# Patient Record
Sex: Female | Born: 1975 | Race: Asian | Hispanic: No | Marital: Married | Smoking: Never smoker
Health system: Southern US, Community
[De-identification: ages and names within clinical notes are randomized; demographics above are authoritative.]

## PROBLEM LIST (undated history)

## (undated) DIAGNOSIS — G43909 Migraine, unspecified, not intractable, without status migrainosus: Secondary | ICD-10-CM

## (undated) HISTORY — DX: Migraine, unspecified, not intractable, without status migrainosus: G43.909

---

## 2003-09-29 ENCOUNTER — Other Ambulatory Visit: Admission: RE | Admit: 2003-09-29 | Discharge: 2003-09-29 | Payer: Self-pay | Admitting: Obstetrics & Gynecology

## 2005-01-31 ENCOUNTER — Inpatient Hospital Stay (HOSPITAL_COMMUNITY): Admission: AD | Admit: 2005-01-31 | Discharge: 2005-02-04 | Payer: Self-pay | Admitting: Obstetrics & Gynecology

## 2005-02-01 ENCOUNTER — Encounter (INDEPENDENT_AMBULATORY_CARE_PROVIDER_SITE_OTHER): Payer: Self-pay | Admitting: *Deleted

## 2005-02-05 ENCOUNTER — Encounter: Admission: RE | Admit: 2005-02-05 | Discharge: 2005-02-20 | Payer: Self-pay | Admitting: Obstetrics & Gynecology

## 2006-07-29 ENCOUNTER — Ambulatory Visit: Payer: Self-pay | Admitting: Family Medicine

## 2007-05-21 ENCOUNTER — Emergency Department: Payer: Self-pay | Admitting: Emergency Medicine

## 2010-01-02 ENCOUNTER — Inpatient Hospital Stay (HOSPITAL_COMMUNITY): Admission: RE | Admit: 2010-01-02 | Discharge: 2010-01-04 | Payer: Self-pay | Admitting: Obstetrics and Gynecology

## 2010-01-02 ENCOUNTER — Encounter (INDEPENDENT_AMBULATORY_CARE_PROVIDER_SITE_OTHER): Payer: Self-pay | Admitting: Obstetrics and Gynecology

## 2010-01-13 ENCOUNTER — Ambulatory Visit: Payer: Self-pay | Admitting: Nurse Practitioner

## 2010-01-13 ENCOUNTER — Inpatient Hospital Stay (HOSPITAL_COMMUNITY): Admission: AD | Admit: 2010-01-13 | Discharge: 2010-01-13 | Payer: Self-pay | Admitting: Obstetrics and Gynecology

## 2010-09-30 LAB — CBC
MCHC: 34 g/dL (ref 30.0–36.0)
MCV: 83.1 fL (ref 78.0–100.0)
Platelets: 167 10*3/uL (ref 150–400)
Platelets: 213 10*3/uL (ref 150–400)
RBC: 3.9 MIL/uL (ref 3.87–5.11)
RDW: 14.1 % (ref 11.5–15.5)
RDW: 14.3 % (ref 11.5–15.5)
WBC: 7.7 10*3/uL (ref 4.0–10.5)

## 2010-09-30 LAB — RPR: RPR Ser Ql: NONREACTIVE

## 2010-11-28 LAB — HM PAP SMEAR: HM Pap smear: NORMAL

## 2010-11-30 NOTE — H&P (Signed)
[name], [name]                ACCOUNT NO.:  [id number]   MEDICAL RECORD NO.:  [id number]          PATIENT TYPE:  INP   LOCATION:  9112                          FACILITY:  [facility]   PHYSICIAN:  Genia Del, M.D.DATE OF BIRTH:  12-Jul-1976   DATE OF ADMISSION:  01/31/2005  DATE OF DISCHARGE:                                HISTORY & PHYSICAL   [name] is a 35 year old G3, P0, A2, expected date of delivery by  ultrasound January 20, 2005, at 41 weeks 4 days' gestation.   REASON FOR ADMISSION:  Fluid leak since morning of July 20.   HISTORY OF PRESENT ILLNESS:  The patient felt some fluid leak but not  continuously since around 7 a.m. on July 20, increased uterine contractions  with some pain but still irregular.  Fetal movement is positive.  No PIH  symptoms.   PAST MEDICAL HISTORY:  Negative.   PAST SURGICAL HISTORY:  Negative.   PAST OBSTETRICAL HISTORY:  Positive for spontaneous abortions x2, one in  February 2005, the other one in July 2005, with no complications.   FAMILY HISTORY:  Positive for thalassemia.   SOCIAL HISTORY:  The patient is married.  The patient is vegetarian but eats  fish and eggs.   MEDICATIONS:  Prenate vitamins.   No known drug allergies.   HISTORY OF PRESENT PREGNANCY:  First trimester was normal.  An ultrasound  was done at 6+ weeks because of the previous history of spontaneous  abortion.  First trimester labs:  Hemoglobin was 12.5, platelets 237.  B  positive.  Rh negative.  Electrophoresis of hemoglobin normal.  STD workup  negative.  Rubella titer immune.  In the second trimester the patient  declined the triple test.  Ultrasound review of anatomy was within normal  limits at 21 weeks.  In the third trimester, one-hour glucose tolerance test  was normal at 87.  Hemoglobin was 10.4.  Group B strep was negative at 35+  weeks.  Uterine height corresponded well.  Blood pressures remained normal.   REVIEW OF SYSTEMS:  CONSTITUTIONAL:   Negative.  HEENT:  Negative.  CARDIOVASCULAR:  Negative.  RESPIRATORY:  Negative.  GASTROINTESTINAL:  Negative.  UROLOGIC:  Negative.  DERMATOLOGIC:  Negative.  NEUROLOGIC:  Negative.  ENDOCRINOLOGIC:  Negative.   PHYSICAL EXAMINATION:  GENERAL:  No apparent distress.  VITAL SIGNS:  Blood pressure 116/70, regular pulse, respiratory rate normal,  afebrile.  CHEST:  Lungs are clear bilaterally.  CARDIAC:  Regular cardiac rhythm.  ABDOMEN:  Gravid uterus, cephalic presentation.  Fetal heart rate 145.  PELVIC:  Vaginal exam 1 cm dilated, long and posterior cervix, vertex, -2.  Nitrazine positive, fern positive.   IMPRESSION:  Forty-one weeks four days' gestation with spontaneous rupture  of membranes, group B strep negative, not currently in active labor.   PLAN:  Admit to labor and delivery.  Monitoring.  Induction with Pitocin.  Antibiotics at 12 hours from spontaneous rupture of membranes.  Expectant  management with probable vaginal delivery.       ML/MEDQ  D:  02/02/2005  T:  02/02/2005  Job:  [id number]

## 2010-11-30 NOTE — Discharge Summary (Signed)
**Note Gina via Obfuscation** NAMEDEBERAH, ADOLF                ACCOUNT NO.:  [id number]   MEDICAL RECORD NO.:  [id number]          PATIENT TYPE:  INP   LOCATION:  9112                          [facility]ACILITY:  [facility]   PHYSICIAN:  Maxie Better, M.D.DATE OF BIRTH:  [birth year]   DATE OF ADMISSION:  01/31/2005  DATE OF DISCHARGE:  02/04/2005                                 DISCHARGE SUMMARY   ADMISSION DIAGNOSES:  1.  Post dates.  2.  Spontaneous rupture of membranes.   DISCHARGE DIAGNOSES:  1.  Post dates, delivered.  2.  Arrest of dilatation.  3.  Presumed chorioamnionitis.   PROCEDURE:  Primary low transverse cesarean section.   HISTORY OF PRESENT ILLNESS:  A 35 year old gravida 3, para 0 female at 53-  4/7 weeks admitted by Dr. Seymour Bars on January 31, 2005 with spontaneous rupture  of membranes.  Her group B Strep culture was negative.   HOSPITAL COURSE:  The patient was admitted.  At the time of her admission  she was 1 cm, long, posterior, and -2, fern positive.  The patient was  monitored.  The patient was started on Pitocin by Dr. Billy Coast on July 21.  An  intrauterine pressure catheter was subsequently placed.  At the time the  examination was 2-3, 80%, 0 station.  She subsequently had an epidural  placed.  The uterine contractions were noted to be inadequate.  Pitocin was  continued.  Unasyn was started on order by Dr. Seymour Bars.  The patient  subsequently spiked a temperature to 100.3 during her labor course.  At that  time she was 4, 80%, -1.  Fetal heart rate tracing showed a baseline of  150s, contracting every three minutes.  Gentamicin was added to her regimen.  Cervical examination remained unchanged and recommendation for cesarean  section was then done.  The patient was taken to the operating room after  obtaining consent and risk of procedure reviewed.  The findings at the time  of surgery which was a primary cesarean section was a live female 8 pounds 1  ounce in the left occiput transverse position  compound presentation, Apgars  of 8 and 9.  The placenta was sent to pathology that showed some alveolitis  of uncertain etiology.  Clindamycin was added to her regimen  postoperatively.  The patient had increase of vaginal bleeding  postoperatively which resulted in Methergine being given.  Her CBC on  postoperative day #1 showed a hemoglobin 7, white count 11,000.  Her  preoperative hemoglobin, however, was 10.2.  Platelet count postoperative  day #1 was 271,000.  She was continued on antibiotics until she became  afebrile greater than 24 hours.  By postoperative day #3 the patient had a  bowel movement tolerating regular diet.  Incision showed no erythema,  induration, or exudate.  She was asymptomatic from her anemia and was deemed  well to be discharged home.   DISPOSITION:  Home.   CONDITION ON DISCHARGE:  Stable.   DISCHARGE MEDICATIONS:  1.  Tylox #30 one p.o. q.4h. p.r.n. pain.  2.  Chromagen Forte one p.o. b.i.d.  3.  Continue prenatal vitamins.   DISCHARGE INSTRUCTIONS:  Per the postpartum booklet given.   FOLLOW-UP APPOINTMENT:  In four to six weeks at [facility].      Maxie Better, M.D.  Electronically Signed     [place]/MEDQ  D:  03/11/2005  T:  03/11/2005  Job:  [id number]

## 2010-11-30 NOTE — Op Note (Signed)
[name], [name]                ACCOUNT NO.:  [id number]   MEDICAL RECORD NO.:  [id number]          PATIENT TYPE:  INP   LOCATION:  9112                          [facility]   PHYSICIAN:  Maxie Better, M.D.DATE OF BIRTH:  April 10, 1976   DATE OF PROCEDURE:  02/01/2005  DATE OF DISCHARGE:                                 OPERATIVE REPORT   PREOPERATIVE DIAGNOSES:  1.  Arrest of dilatation.  2.  Presumed chorioamnionitis.  3.  Post dates.   OPERATION/PROCEDURE:  1.  Primary cesarean section.  2.  Sharl Ma hysterotomy.   POSTOPERATIVE DIAGNOSES:  1.  Arrest of dilatation.  2.  Presumed chorioamnionitis.  3.  Post dates.   ANESTHESIA:  Epidural.   SURGEON:  Maxie Better, M.D.   ASSISTANT:  None.   DESCRIPTION OF PROCEDURE:  Under adequate epidural anesthesia, the patient  was placed in the supine position with the left lateral tilt.  An indwelling  Foley catheter was already in place.  The patient was sterilely prepped and  draped in the usual fashion and 0.25% Marcaine was injected along the  planned Pfannenstiel skin incision.  Pfannenstiel skin incision was then  made, carried down to the rectus fascia.  Rectus fascia was incised in the  midline, extended bilaterally.  The rectus fascia was then bluntly and  sharply dissected off the rectus muscle in superior and inferior fashion.  The rectus muscle was split in the midline.  The parietal peritoneum was  entered sharply and extended.  The lower uterine segment was well developed.  The vesicouterine peritoneum then opened transversely.  The bladder was then  bluntly dissected off the lower uterine segment and displaced inferiorly  using a bladder retractor.  A curvilinear low transverse uterine incision  was then made and extended bilaterally using the bandage scissors.  On entry  into the endometrial cavity, meconium fluid was noted.  The baby was in the  left occiput transverse position.  Vertex was then  elevated into the field.  Vacuum was applied to facilitate the delivery.  Subsequently a live female  was delivered with the right hand  to the shoulder level.  Baby DeLee'd and  bulb suctioned on the abdomen.  Cord was clamped and the baby was  transferred to the awaiting pediatrician who assigned Apgars of 8 and 9 at  one and five minutes.  The placenta was spontaneous intact and sent to  pathology. The uterus was  exteriorized.  The uterus was atonic.  Manual  massage was performed.  Pitocin was started.  The uterus was cleaned of  debris.  The uterine incision had no extension.  Uterine was closed with in  two layers, the first layer with 0 Monocryl, running locked stitch.  Second  layer was imbricating using 0 Monocryl suture.  Good hemostasis was noted.  The uterus was then returned to the abdomen.  Normal tubes and ovaries were  noted bilaterally.  The abdomen was copiously irrigated and suctioned of  debris.  Inspection of the incision showed good hemostasis.  The parietal  peritoneum was then closed with 2-0  Vicryl sutures.  The rectus fascia was  closed with 0 Vicryl x2.  The skin was approximated using Ethicon staples.  Specimen was placenta sent to pathology.  Estimated blood loss was 700 mL.   SPECIMENS:  Placenta sent to pathology.   ESTIMATED BLOOD LOSS:  700 mL.   INTRAOPERATIVE FLUIDS:  2 L.   URINARY OUTPUT:  200 mL clear yellow urine.   COUNTS:  Sponge and instruments counts x3 were correct.   COMPLICATIONS:  None.   The weight of the baby was 8 pounds 1 ounce.  The patient tolerated the  procedure well and was transverse to the recovery room in stable condition.       [place]/MEDQ  D:  02/02/2005  T:  02/02/2005  Job:  [id number]

## 2010-11-30 NOTE — Assessment & Plan Note (Signed)
[facility]                           [place] OFFICE NOTE   [name], [name]                         MRN:          [id number]  DATE:07/29/2006                            DOB:          [birth year]    CHIEF COMPLAINT:  35 year old female here to establish new doctor.   HISTORY OF PRESENT ILLNESS:  [name] moved to this area from  [place] in August 2006.  She has been cared for over the last year by  an OB/GYN because she has had a recent delivery in July 2007.  She has  had some difficulty losing weight from her pregnancy and would like to  discuss that briefly.  In addition she has been having thinning of her  hair over the past 2 months.  She states that this has stopped now, but  her hair is still much thinner that it was before.  She had noticed  multiple hairs coming out when brushing her hair.  She reports good  energy, normal mood, and no problems with thyroid in the family.   REVIEW OF SYSTEMS:  Frequent headache, uses Advil, happens q. 1-2  months.  Lasts a few hours per day, associated with nausea, vomiting,  photo and phonophobia.  No glasses, no problems with hearing, no  dyspnea, no chest pain, no shortness of breath, no diarrhea, no  constipation, no rectal bleeding, no myalgia nor arthralgia.   PAST MEDICAL HISTORY:  None.   HOSPITALIZATION/SURGERIES/PROCEDURES:  C-section in July 2007.  Pap  smear in August 2007.   SOCIAL HISTORY:  No smoking, no alcohol, no drug use.  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  She has been married for 7 years and denies  physical or domestic abuse.  She is from [place] originally.  She has one  female child who is healthy.  She walks 3 times per week on a treadmill.  Her mother and father-in-law live with the family.  She eats fruits and  vegetables, and is a vegetarian.  She gets her protein through beans and  pulses as well as milk.   FAMILY HISTORY:  Her father is alive at age 73  with migraine.  Mother  alive at age 3 with asthma, hypertension.  No family history of heart  attack before age 36.  She has one sister who has problems with  headaches.  No diabetes, no types of cancer run in the family.   PHYSICAL EXAMINATION:  VITAL SIGNS:  Height 63 inches, weight 135 making  BMI 25.  Blood pressure 102/60, pulse 76, temperature 98.  GENERAL:  Healthy-appearing slightly overweight female in no apparent  distress.  HEENT:  PERRLA, extraocular muscles intact, oropharynx clear, tympanic  membranes clear, nares clear, no thyromegaly, no lymphadenopathy  supraclavicular or cervical.  CARDIOVASCULAR:  Regular rate and rhythm, no murmurs, rubs, or gallops.  PULMONARY:  Clear to auscultation bilaterally, no wheezes, rales, or  rhonchi.  ABDOMEN:  Soft, nontender, normoactive bowel sounds, no  hepatosplenomegaly.  MUSCULOSKELETAL:  Strength 5/5 in upper and lower extremities.  NEURO:  Alert  and oriented x3, cranial nerves II-XII grossly intact.   ASSESSMENT/PLAN:  1. Telogen effluvium:  Most likely her hair loss was secondary to post      partum alopecia or stress within the last few months.  She was      given the information about this and reassured that hair loss was      unlikely to recur.  We can also consider checking a thyroid panel,      although she has no other symptoms at this point in time.  2. Frequent headaches:  Her description of headaches meet the      diagnosis of migraine.  She will continue to use Advil as needed.      She will let me know if her headaches become more frequent or need      stronger medicine for acute treatment.  She will also look into      possible migraine triggers.  3. Prevention:  She is up to date with Pap Smear.  She is up to date      with her vaccines.  She will consider getting a cholesterol and      complete metabolic panel, although it sounds like she had this      within the last year.  We can obtain this within the last  year.  I      did tell her that there is no need if it was normal last year to      repeat it this year, and that she needs it more like every 5 years.      She was encouraged to continue exercise and healthy eating habits.     Kerby Nora, MD  Electronically Signed    AB/MedQ  DD: 07/29/2006  DT: 07/30/2006  Job #: [id number]

## 2012-04-08 ENCOUNTER — Telehealth: Payer: Self-pay | Admitting: Family Medicine

## 2012-04-08 NOTE — Telephone Encounter (Signed)
Okay to re-establish? 

## 2012-04-08 NOTE — Telephone Encounter (Signed)
The pt called hoping to re-est with you as a patient.   Her last ov was 07/2006.   Thanks!

## 2012-04-09 NOTE — Telephone Encounter (Signed)
I left a message on patient's voice mail that Dr.Bedsole said it's ok for her to re-establish.  I asked patient to call me back to schedule appointment.

## 2012-04-09 NOTE — Telephone Encounter (Signed)
Pt called back and scheduled for her and her husband. Thanks!

## 2012-05-12 ENCOUNTER — Encounter: Payer: Self-pay | Admitting: Family Medicine

## 2012-05-12 ENCOUNTER — Ambulatory Visit (INDEPENDENT_AMBULATORY_CARE_PROVIDER_SITE_OTHER): Payer: BC Managed Care – PPO | Admitting: Family Medicine

## 2012-05-12 ENCOUNTER — Other Ambulatory Visit (HOSPITAL_COMMUNITY)
Admission: RE | Admit: 2012-05-12 | Discharge: 2012-05-12 | Disposition: A | Discharge status: Home / Self Care | Payer: BC Managed Care – PPO | Source: Ambulatory Visit | Attending: Family Medicine | Admitting: Family Medicine

## 2012-05-12 VITALS — BP 100/70 | Temp 98.3°F | Ht 63.0 in | Wt 139.0 lb

## 2012-05-12 DIAGNOSIS — Z01419 Encounter for gynecological examination (general) (routine) without abnormal findings: Secondary | ICD-10-CM | POA: Insufficient documentation

## 2012-05-12 DIAGNOSIS — Z13220 Encounter for screening for lipoid disorders: Secondary | ICD-10-CM

## 2012-05-12 DIAGNOSIS — Z1151 Encounter for screening for human papillomavirus (HPV): Secondary | ICD-10-CM | POA: Insufficient documentation

## 2012-05-12 DIAGNOSIS — Z0000 Encounter for general adult medical examination without abnormal findings: Secondary | ICD-10-CM

## 2012-05-12 LAB — HM PAP SMEAR: HM PAP: NEGATIVE

## 2012-05-12 NOTE — Patient Instructions (Addendum)
Get back on track with diet and exercise.  Return for fasting labs.

## 2012-05-12 NOTE — Progress Notes (Signed)
Subjective:    Patient ID: [name], female    DOB: [birth year], 36 y.o.   MRN: [id number]  HPI The patient is here for annual wellness exam and preventative care.     Has not had a CPX in 2 years.  She noted red patched on abdomen and  Breast abdomen 1 month ago, gone now.  Mildly itchy. No new exposures.  Dances 1 hour per week. No other exercise.  Diet:moderate.  Review of Systems  Constitutional: Negative for fever, fatigue and unexpected weight change.  HENT: Negative for ear pain, congestion, sore throat, sneezing, trouble swallowing and sinus pressure.   Eyes: Negative for pain and itching.  Respiratory: Negative for cough, shortness of breath and wheezing.   Cardiovascular: Negative for chest pain, palpitations and leg swelling.  Gastrointestinal: Negative for nausea, abdominal pain, diarrhea, constipation and blood in stool.  Genitourinary: Negative for dysuria, hematuria, vaginal discharge, difficulty urinating and menstrual problem.  Skin: Negative for rash.  Neurological: Positive for headaches. Negative for syncope, weakness, light-headedness and numbness.  Psychiatric/Behavioral: Negative for confusion and dysphoric mood. The patient is not nervous/anxious.        Objective:   Physical Exam  Constitutional: Vital signs are normal. She appears well-developed and well-nourished. She is cooperative.  Non-toxic appearance. She does not appear ill. No distress.  HENT:  Head: Normocephalic.  Right Ear: Hearing, tympanic membrane, external ear and ear canal normal.  Left Ear: Hearing, tympanic membrane, external ear and ear canal normal.  Nose: Nose normal.  Eyes: Conjunctivae normal, EOM and lids are normal. Pupils are equal, round, and reactive to light. No foreign bodies found.  Neck: Trachea normal and normal range of motion. Neck supple. Carotid bruit is not present. No mass and no thyromegaly present.  Cardiovascular: Normal rate, regular rhythm, S1 normal,  S2 normal, normal heart sounds and intact distal pulses.  Exam reveals no gallop.   No murmur heard. Pulmonary/Chest: Effort normal and breath sounds normal. No respiratory distress. She has no wheezes. She has no rhonchi. She has no rales.  Abdominal: Soft. Normal appearance and bowel sounds are normal. She exhibits no distension, no fluid wave, no abdominal bruit and no mass. There is no hepatosplenomegaly. There is no tenderness. There is no rebound, no guarding and no CVA tenderness. No hernia.  Genitourinary: Vagina normal and uterus normal. No breast swelling, tenderness, discharge or bleeding. Pelvic exam was performed with patient prone. There is no rash, tenderness or lesion on the right labia. There is no rash, tenderness or lesion on the left labia. Uterus is not enlarged and not tender. Cervix exhibits no motion tenderness, no discharge and no friability. Right adnexum displays no mass, no tenderness and no fullness. Left adnexum displays no mass, no tenderness and no fullness.  Lymphadenopathy:    She has no cervical adenopathy.    She has no axillary adenopathy.  Neurological: She is alert. She has normal strength. No cranial nerve deficit or sensory deficit.  Skin: Skin is warm, dry and intact. No rash noted.  Psychiatric: Her speech is normal and behavior is normal. Judgment normal. Her mood appears not anxious. Cognition and memory are normal. She does not exhibit a depressed mood.          Assessment & Plan:  The patient's preventative maintenance and recommended screening tests for an annual wellness exam were reviewed in full today. Brought up to date unless services declined.  Counselled on the importance of diet, exercise, and its  role in overall health and mortality. The patient's FH and SH was reviewed, including their home life, tobacco status, and drug and alcohol status.   Vaccines:Tdap, refuses flu vaccine Nonsmoker PAP/DVE: Last 2011.  Breast exam: No family  history. No rash remains.

## 2012-06-29 ENCOUNTER — Ambulatory Visit (INDEPENDENT_AMBULATORY_CARE_PROVIDER_SITE_OTHER): Payer: BC Managed Care – PPO | Admitting: Family Medicine

## 2012-06-29 ENCOUNTER — Encounter: Payer: Self-pay | Admitting: Family Medicine

## 2012-06-29 VITALS — BP 120/70 | HR 110 | Temp 100.1°F | Ht 63.0 in | Wt 138.5 lb

## 2012-06-29 DIAGNOSIS — J111 Influenza due to unidentified influenza virus with other respiratory manifestations: Secondary | ICD-10-CM

## 2012-06-29 DIAGNOSIS — R509 Fever, unspecified: Secondary | ICD-10-CM

## 2012-06-29 LAB — POCT INFLUENZA A/B: Influenza A, POC: POSITIVE

## 2012-06-29 MED ORDER — OSELTAMIVIR PHOSPHATE 75 MG PO CAPS: 75.0000 mg | ORAL_CAPSULE | Freq: Two times a day (BID) | ORAL | Status: DC

## 2012-06-29 MED ORDER — HYDROCOD POLST-CHLORPHEN POLST 10-8 MG/5ML PO LQCR: ORAL | Status: DC

## 2012-06-29 NOTE — Progress Notes (Signed)
Patient Name: [name] Date of Birth: 02/09/1976 Medical Record Number: [id number] Gender: female  History of Present Illness:  [name] presents with runny nose, sneezing, cough, sore throat, malaise, myalgias, arthralgia, chills, and fever. Yellow mucous Started on Friday 103 fever last night. 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 No known flu.  The patent denies sore throat as the primary complaint. Denies sthortness of breath/wheezing, otalgia, facial pain, abdominal pain, changes in bowel or bladder.  Generally feels terrible  Tmax: 103  PMH, PHS, Allergies, Problem List, Medications, Family History, and Social History have all been reviewed.  Review of Systems: as above, eating and drinking - tolerating PO. Urinating normally. No excessive vomitting or diarrhea. O/w as above.  Physical Exam:  Filed Vitals:   06/29/12 1030  BP: 120/70  Pulse: 110  Temp: 100.1 F (37.8 C)  TempSrc: Oral  Height: 5\' 3"  (1.6 m)  Weight: 138 lb 8 oz (62.823 kg)  SpO2: 96%    Gen: WDWN, NAD; A & O x3, cooperative. Pleasant.Globally Non-toxic HEENT: Normocephalic and atraumatic. Throat clear, w/o exudate, R TM clear, L TM - good landmarks, No fluid present. rhinnorhea. No frontal or maxillary sinus T. MMM NECK: Anterior cervical  LAD is absent CV: RRR, No M/G/R, cap refill <2 sec PULM: Breathing comfortably in no respiratory distress. no wheezing, crackles, rhonchi ABD: S,NT,ND,+BS. No HSM. No rebound. EXT: No c/c/e PSYCH: Friendly, good eye contact MSK: Nml gait  Assessment and Plan: 1. Influenza: The patient's clinical exam and history is consistent with a diagnosis of influenza.  Supportive care. Fluids. Cough medicines as needed  Anti-pyretics.  Infection control emphasized, including OOW or school until AF 24 hours.    1. Influenza with respiratory manifestations    2. Fever  Influenza A/B   Results for orders placed in  visit on 06/29/12  POCT INFLUENZA A/B      Component Value Range   Influenza A, POC Positive     Influenza B, POC        Updated Medication List: (Includes new medications, updates to list, dose adjustments) Meds ordered this encounter  Medications  . oseltamivir (TAMIFLU) 75 MG capsule    Sig: Take 1 capsule (75 mg total) by mouth 2 (two) times daily.    Dispense:  10 capsule    Refill:  0  . chlorpheniramine-HYDROcodone (TUSSIONEX) 10-8 MG/5ML LQCR    Sig: 5 mL po qhs prn cough    Dispense:  240 mL    Refill:  0    Medications Discontinued: There are no discontinued medications.

## 2012-07-01 ENCOUNTER — Telehealth: Payer: Self-pay | Admitting: Family Medicine

## 2012-07-01 NOTE — Telephone Encounter (Signed)
Patient Information:  Caller Name: Khala  Phone: [phone]  Patient: [name], [name]  Gender: Female  DOB: 12-May-1976  Age: 36 Years  PCP: Kerby Nora (Family Practice)  Pregnant: No  Office Follow Up:  Does the office need to follow up with this patient?: No  Instructions For The Office: N/A  RN Note:  Per [employer] advised that she may take medications concurrently.  Lost connection near conclusion of the call. Left voice mail reviewing home care instructions and to call back if further questions or concerns.  Symptoms  Reason For Call & Symptoms: Flu diagnosed by Dr. Patsy Lager on 06/29/12.  Headache does not go away.  Fever improved.  Headache rated at 2-3 of 10.  Caller asks if she can take Acetaminophen 250mg  and Aspirin 250 mg along with Tamiflu.  Reviewed Health History In EMR: Yes  Reviewed Medications In EMR: Yes  Reviewed Allergies In EMR: Yes  Reviewed Surgeries / Procedures: Yes  Date of Onset of Symptoms: 06/28/2012  Treatments Tried: Tamiflu, Rx cough syrup.  Some improvement.  Treatments Tried Worked: Yes OB:  LMP: 06/03/2012  Guideline(s) Used:  Headache  Headache  Influenza - Seasonal  Influenza Follow-Up Call  Disposition Per Guideline:   Home Care  Reason For Disposition Reached:   Influenza (diagnosed by HCP) and no complications  Advice Given:  Rest:   Lie down in a dark, quiet place and try to relax. Close your eyes and imagine your entire body relaxing.  Apply Cold to the Area:   Apply a cold wet washcloth or cold pack to the forehead for 20 minutes.  Call Back If:  Headache lasts longer than 24 hours  You become worse.  Influenza - Symptoms:  However, the fever is usually higher (102 - 104 F; 38.9 - 40 C) with influenza than with a cold. Headaches and muscle aches are also worse with Influenza.  The symptoms often come on suddenly. One day a person can feel fine and the next day feel miserable.  Influenza - How to Protect Others -  Avoid People at HIGH RISK for Complications:  Persons less than 73 years old  Persons 57 years and older  Pregnant women and women up to 2 weeks postpartum  Chronic medical conditions, including: cardiovascular (not hypertension), chronic pulmonary conditions (e.g., asthma, emphysema), renal failure, hepatic, hematologic (e.g., sickle cell disease), neurologic, neuromuscular, and diabetes mellitus  Influenza - Isolation Is Needed Until After Fever Is Gone:  If you have flu-like symptoms, please stay at home until at least 24 hours after you are free of fever.  Do Not go to work or school.  Do Not go to church, child care centers, shopping, or other public places.  Do Not shake hands.  Do Not share eating utensils (e.g., spoon, fork)  Avoid close contact with others (hugging, kissing).

## 2012-07-01 NOTE — Telephone Encounter (Signed)
Noted  

## 2012-07-01 NOTE — Telephone Encounter (Signed)
Agree   Hannah Beat, MD 07/01/2012, 2:52 PM

## 2012-09-07 ENCOUNTER — Ambulatory Visit (INDEPENDENT_AMBULATORY_CARE_PROVIDER_SITE_OTHER): Payer: BC Managed Care – PPO | Admitting: Family Medicine

## 2012-09-07 ENCOUNTER — Encounter: Payer: Self-pay | Admitting: Family Medicine

## 2012-09-07 VITALS — BP 100/72 | HR 77 | Temp 98.3°F | Ht 63.0 in | Wt 143.5 lb

## 2012-09-07 DIAGNOSIS — J020 Streptococcal pharyngitis: Secondary | ICD-10-CM

## 2012-09-07 DIAGNOSIS — J029 Acute pharyngitis, unspecified: Secondary | ICD-10-CM

## 2012-09-07 LAB — POCT RAPID STREP A (OFFICE): Rapid Strep A Screen: POSITIVE — AB

## 2012-09-07 MED ORDER — AMOXICILLIN 500 MG PO TABS: 1000.0000 mg | ORAL_TABLET | Freq: Two times a day (BID) | ORAL | Status: DC

## 2012-09-07 NOTE — Assessment & Plan Note (Signed)
amox x 10 days, symptomatic care.

## 2012-09-07 NOTE — Progress Notes (Signed)
  Subjective:    Patient ID: [name], female    DOB: 1975-10-13, 37 y.o.   MRN: [id number]  Sore Throat  This is a new problem. The current episode started yesterday. The problem has been gradually worsening. Neither side of throat is experiencing more pain than the other. There has been no fever. Associated symptoms include congestion, coughing, headaches, swollen glands and trouble swallowing. Pertinent negatives include no drooling, ear pain, neck pain or shortness of breath. Associated symptoms comments: Fatigue  mild cough, congestion. She has had no exposure to strep or mono. Exposure to: son with viral infection, works at [employer]. She has tried NSAIDs for the symptoms. The treatment provided mild relief.      Review of Systems  HENT: Positive for congestion and trouble swallowing. Negative for ear pain, drooling and neck pain.   Respiratory: Positive for cough. Negative for shortness of breath.   Neurological: Positive for headaches.       Objective:   Physical Exam  Constitutional: Vital signs are normal. She appears well-developed and well-nourished. She is cooperative.  Non-toxic appearance. She does not appear ill. No distress.  HENT:  Head: Normocephalic.  Right Ear: Hearing, tympanic membrane, external ear and ear canal normal. Tympanic membrane is not erythematous, not retracted and not bulging.  Left Ear: Hearing, tympanic membrane, external ear and ear canal normal. Tympanic membrane is not erythematous, not retracted and not bulging.  Nose: Mucosal edema and rhinorrhea present. Right sinus exhibits no maxillary sinus tenderness and no frontal sinus tenderness. Left sinus exhibits no maxillary sinus tenderness and no frontal sinus tenderness.  Mouth/Throat: Uvula is midline and mucous membranes are normal. Posterior oropharyngeal erythema present. No oropharyngeal exudate or tonsillar abscesses.  Eyes: Conjunctivae, EOM and lids are normal. Pupils are equal, round,  and reactive to light. No foreign bodies found.  Neck: Trachea normal and normal range of motion. Neck supple. Carotid bruit is not present. No mass and no thyromegaly present.  Cardiovascular: Normal rate, regular rhythm, S1 normal, S2 normal, normal heart sounds, intact distal pulses and normal pulses.  Exam reveals no gallop and no friction rub.   No murmur heard. Pulmonary/Chest: Effort normal and breath sounds normal. Not tachypneic. No respiratory distress. She has no decreased breath sounds. She has no wheezes. She has no rhonchi. She has no rales.  Neurological: She is alert.  Skin: Skin is warm, dry and intact. No rash noted.  Psychiatric: Her speech is normal and behavior is normal. Judgment normal. Her mood appears not anxious. Cognition and memory are normal. She does not exhibit a depressed mood.          Assessment & Plan:

## 2012-09-07 NOTE — Patient Instructions (Addendum)
Complete amoxicillin x 10 days, Ibuprofen 600 mg every 6 hours for pain and fever.

## 2012-09-14 ENCOUNTER — Telehealth: Payer: Self-pay | Admitting: *Deleted

## 2012-09-14 NOTE — Telephone Encounter (Signed)
Pt c/o diarrhea since started abx last Mon. She had severe cramping and episodes of diarrhea 3 times a day. She stopped taking abx on Sat and wants to know if you can call anything else in or change the dose of the abx she was on. [facility]ses [facility] [address]

## 2012-09-14 NOTE — Telephone Encounter (Signed)
Zpak, 2 tabs po on day 1, then 1 tab po for 4 days  #6

## 2012-09-15 MED ORDER — AZITHROMYCIN 250 MG PO TABS: ORAL_TABLET | ORAL | Status: DC

## 2012-09-15 NOTE — Telephone Encounter (Signed)
rx sent to pharmacy and patient advised. 

## 2014-08-11 LAB — HEPATIC FUNCTION PANEL
ALK PHOS: 83 U/L (ref 25–125)
ALT: 37 U/L — AB (ref 7–35)
AST: 26 U/L (ref 13–35)
BILIRUBIN, TOTAL: 0.4 mg/dL

## 2014-08-11 LAB — CBC AND DIFFERENTIAL
HCT: 35 % — AB (ref 36–46)
Hemoglobin: 12.5 g/dL (ref 12.0–16.0)
NEUTROS ABS: 49 /uL
PLATELETS: 222 10*3/uL (ref 150–399)
WBC: 3.8 10*3/mL

## 2014-08-11 LAB — LIPID PANEL
CHOLESTEROL: 139 mg/dL (ref 0–200)
HDL: 42 mg/dL (ref 35–70)
LDL Cholesterol: 83 mg/dL
TRIGLYCERIDES: 68 mg/dL (ref 40–160)

## 2014-08-11 LAB — BASIC METABOLIC PANEL
BUN: 19 mg/dL (ref 4–21)
Creatinine: 0.6 mg/dL (ref 0.5–1.1)
GLUCOSE: 93 mg/dL
POTASSIUM: 4.7 mmol/L (ref 3.4–5.3)
Sodium: 139 mmol/L (ref 137–147)

## 2014-08-11 LAB — TSH: TSH: 4.9 u[IU]/mL (ref 0.41–5.90)

## 2014-11-07 ENCOUNTER — Ambulatory Visit: Payer: BLUE CROSS/BLUE SHIELD | Admitting: Internal Medicine

## 2014-11-28 ENCOUNTER — Ambulatory Visit (INDEPENDENT_AMBULATORY_CARE_PROVIDER_SITE_OTHER): Payer: BLUE CROSS/BLUE SHIELD | Admitting: Internal Medicine

## 2014-11-28 ENCOUNTER — Encounter (INDEPENDENT_AMBULATORY_CARE_PROVIDER_SITE_OTHER): Payer: Self-pay

## 2014-11-28 ENCOUNTER — Encounter: Payer: Self-pay | Admitting: Internal Medicine

## 2014-11-28 VITALS — BP 93/62 | HR 78 | Temp 98.4°F | Ht 63.0 in | Wt 141.4 lb

## 2014-11-28 DIAGNOSIS — R5382 Chronic fatigue, unspecified: Secondary | ICD-10-CM | POA: Diagnosis not present

## 2014-11-28 DIAGNOSIS — E559 Vitamin D deficiency, unspecified: Secondary | ICD-10-CM | POA: Diagnosis not present

## 2014-11-28 DIAGNOSIS — E079 Disorder of thyroid, unspecified: Secondary | ICD-10-CM

## 2014-11-28 DIAGNOSIS — E538 Deficiency of other specified B group vitamins: Secondary | ICD-10-CM | POA: Diagnosis not present

## 2014-11-28 LAB — VITAMIN B12: VITAMIN B 12: 234 pg/mL (ref 211–911)

## 2014-11-28 LAB — COMPREHENSIVE METABOLIC PANEL
ALK PHOS: 72 U/L (ref 39–117)
ALT: 19 U/L (ref 0–35)
AST: 16 U/L (ref 0–37)
Albumin: 4.1 g/dL (ref 3.5–5.2)
BILIRUBIN TOTAL: 0.4 mg/dL (ref 0.2–1.2)
BUN: 12 mg/dL (ref 6–23)
CO2: 31 meq/L (ref 19–32)
CREATININE: 0.58 mg/dL (ref 0.40–1.20)
Calcium: 9.6 mg/dL (ref 8.4–10.5)
Chloride: 103 mEq/L (ref 96–112)
GFR: 124.46 mL/min (ref >60.00)
Glucose, Bld: 96 mg/dL (ref 70–99)
Potassium: 4.2 mEq/L (ref 3.5–5.1)
Sodium: 138 mEq/L (ref 135–145)
Total Protein: 7 g/dL (ref 6.0–8.3)

## 2014-11-28 LAB — CBC WITH DIFFERENTIAL/PLATELET
Basophils Absolute: 0 10*3/uL (ref 0.0–0.1)
Basophils Relative: 0.6 % (ref 0.0–3.0)
EOS PCT: 2.9 % (ref 0.0–5.0)
Eosinophils Absolute: 0.2 10*3/uL (ref 0.0–0.7)
HEMATOCRIT: 36.7 % (ref 36.0–46.0)
Hemoglobin: 12.4 g/dL (ref 12.0–15.0)
LYMPHS ABS: 2.4 10*3/uL (ref 0.7–4.0)
Lymphocytes Relative: 30.8 % (ref 12.0–46.0)
MCHC: 34 g/dL (ref 30.0–36.0)
MCV: 84.1 fl (ref 78.0–100.0)
MONOS PCT: 5.3 % (ref 3.0–12.0)
Monocytes Absolute: 0.4 10*3/uL (ref 0.1–1.0)
NEUTROS PCT: 60.4 % (ref 43.0–77.0)
Neutro Abs: 4.8 10*3/uL (ref 1.4–7.7)
PLATELETS: 270 10*3/uL (ref 150.0–400.0)
RBC: 4.36 Mil/uL (ref 3.87–5.11)
RDW: 12.7 % (ref 11.5–15.5)
WBC: 7.9 10*3/uL (ref 4.0–10.5)

## 2014-11-28 LAB — VITAMIN D 25 HYDROXY (VIT D DEFICIENCY, FRACTURES): VITD: 29.52 ng/mL — AB (ref 30.00–100.00)

## 2014-11-28 LAB — FERRITIN: FERRITIN: 15 ng/mL (ref 10.0–291.0)

## 2014-11-28 LAB — TSH: TSH: 1.75 u[IU]/mL (ref 0.35–4.50)

## 2014-11-28 LAB — T4, FREE: FREE T4: 0.75 ng/dL (ref 0.60–1.60)

## 2014-11-28 NOTE — Assessment & Plan Note (Signed)
Recent Vit D 10. S/p 2 months supplementation with 50,000IU weekly. Will repeat Vit D level with labs.

## 2014-11-28 NOTE — Assessment & Plan Note (Signed)
Recent generalized fatigue. Suspect B12 and iron deficiency. Will repeat CBC, B12, and ferritin with labs.

## 2014-11-28 NOTE — Progress Notes (Signed)
Pre visit review using our clinic review tool, if applicable. No additional management support is needed unless otherwise documented below in the visit note. 

## 2014-11-28 NOTE — Progress Notes (Signed)
Subjective:    Patient ID: Gina LoseKajal N Hendrix, female    DOB: 1976/04/08, 39 y.o.   MRN: [id number]  HPI  38YO female presents to establish care.   Feeling more tired recently. 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 Feels exhausted end of day.  Had blood work with friend who is a Development worker, communityphysician. TSH was elevated at 4.9. T4 normal. Vit D low 10. Started at 50000iu x 2 months. Now using 5000units daily. Vit B12 was also low 254. Has not started this. No improvement in fatigue. Vegetarian.  Past medical, surgical, family and social history per today's encounter.  Review of Systems  Constitutional: Positive for fatigue. Negative for fever, chills, appetite change and unexpected weight change.  Eyes: Negative for visual disturbance.  Respiratory: Negative for shortness of breath.   Cardiovascular: Negative for chest pain and leg swelling.  Gastrointestinal: Negative for nausea, vomiting, abdominal pain, diarrhea and constipation.  Musculoskeletal: Negative for myalgias and arthralgias.  Skin: Negative for color change and rash.  Neurological: Negative for weakness.  Hematological: Negative for adenopathy. Does not bruise/bleed easily.  Psychiatric/Behavioral: Negative for suicidal ideas, sleep disturbance and dysphoric mood. The patient is not nervous/anxious.        Objective:    BP 93/62 mmHg  Pulse 78  Temp(Src) 98.4 F (36.9 C) (Oral)  Ht 5\' 3"  (1.6 m)  Wt 141 lb 6 oz (64.127 kg)  BMI 25.05 kg/m2  SpO2 98%  LMP 11/23/2014 Physical Exam  Constitutional: She is oriented to person, place, and time. She appears well-developed and well-nourished. No distress.  HENT:  Head: Normocephalic and atraumatic.  Right Ear: External ear normal.  Left Ear: External ear normal.  Nose: Nose normal.  Mouth/Throat: Oropharynx is clear and moist. No oropharyngeal exudate.  Eyes: Conjunctivae and EOM are normal. Pupils are equal, round, and reactive to light. Right eye exhibits  no discharge.  Neck: Normal range of motion. Neck supple. No thyromegaly present.  Cardiovascular: Normal rate, regular rhythm, normal heart sounds and intact distal pulses.  Exam reveals no gallop and no friction rub.   No murmur heard. Pulmonary/Chest: Effort normal. No respiratory distress. She has no wheezes. She has no rales.  Abdominal: Soft. Bowel sounds are normal. She exhibits no distension and no mass. There is no tenderness. There is no rebound and no guarding.  Musculoskeletal: Normal range of motion. She exhibits no edema or tenderness.  Lymphadenopathy:    She has no cervical adenopathy.  Neurological: She is alert and oriented to person, place, and time. No cranial nerve deficit. Coordination normal.  Skin: Skin is warm and dry. No rash noted. She is not diaphoretic. No erythema. No pallor.  Psychiatric: She has a normal mood and affect. Her behavior is normal. Judgment and thought content normal.          Assessment & Plan:   Problem List Items Addressed This Visit      Unprioritized   B12 deficiency    Will recheck B12 level with labs today.      Relevant Orders   CBC with Differential/Platelet   B12   Chronic fatigue    Recent generalized fatigue. Suspect B12 and iron deficiency. Will repeat CBC, B12, and ferritin with labs.       Relevant Orders   Ferritin   Thyroid dysfunction    Recent mild elevation of TSH and normal Free t4. Some persistent fatigue.Will repeat TSH with labs today.      Relevant  Orders   Comprehensive metabolic panel   TSH   T4, free   Vitamin D deficiency - Primary    Recent Vit D 10. S/p 2 months supplementation with 50,000IU weekly. Will repeat Vit D level with labs.      Relevant Orders   Vit D  25 hydroxy (rtn osteoporosis monitoring)       Return in about 4 weeks (around 12/26/2014) for Recheck.

## 2014-11-28 NOTE — Assessment & Plan Note (Signed)
Recent mild elevation of TSH and normal Free t4. Some persistent fatigue.Will repeat TSH with labs today.

## 2014-11-28 NOTE — Patient Instructions (Signed)
Labs today.  Follow up in 4 weeks. 

## 2014-11-28 NOTE — Assessment & Plan Note (Signed)
Will recheck B12 level with labs today.

## 2014-11-29 ENCOUNTER — Encounter: Payer: Self-pay | Admitting: *Deleted

## 2014-11-30 ENCOUNTER — Encounter: Payer: Self-pay | Admitting: Internal Medicine

## 2014-11-30 ENCOUNTER — Ambulatory Visit: Payer: BLUE CROSS/BLUE SHIELD

## 2014-12-01 ENCOUNTER — Other Ambulatory Visit: Payer: Self-pay | Admitting: *Deleted

## 2014-12-01 MED ORDER — CYANOCOBALAMIN 1000 MCG/ML IJ SOLN: 1000.0000 ug | INTRAMUSCULAR | Status: DC

## 2014-12-01 MED ORDER — "SYRINGE/NEEDLE (DISP) 25G X 1"" 3 ML MISC": Status: DC

## 2014-12-02 ENCOUNTER — Other Ambulatory Visit: Payer: Self-pay | Admitting: *Deleted

## 2014-12-02 MED ORDER — CYANOCOBALAMIN 1000 MCG/ML IJ SOLN: 1000.0000 ug | INTRAMUSCULAR | Status: DC

## 2014-12-02 NOTE — Telephone Encounter (Signed)
Pharmacy unable to dispense as 30 mL, advised pharmacy to cancel Rx and sending them a new Rx

## 2014-12-05 ENCOUNTER — Encounter: Payer: Self-pay | Admitting: *Deleted

## 2014-12-07 ENCOUNTER — Ambulatory Visit: Payer: BLUE CROSS/BLUE SHIELD

## 2015-08-01 ENCOUNTER — Ambulatory Visit (INDEPENDENT_AMBULATORY_CARE_PROVIDER_SITE_OTHER): Payer: BLUE CROSS/BLUE SHIELD | Admitting: Internal Medicine

## 2015-08-01 ENCOUNTER — Encounter: Payer: Self-pay | Admitting: Internal Medicine

## 2015-08-01 VITALS — BP 108/75 | HR 73 | Temp 98.2°F | Ht 63.0 in | Wt 143.1 lb

## 2015-08-01 DIAGNOSIS — B9789 Other viral agents as the cause of diseases classified elsewhere: Principal | ICD-10-CM

## 2015-08-01 DIAGNOSIS — J069 Acute upper respiratory infection, unspecified: Secondary | ICD-10-CM

## 2015-08-01 MED ORDER — HYDROCOD POLST-CPM POLST ER 10-8 MG/5ML PO SUER: 5.0000 mL | Freq: Two times a day (BID) | ORAL | Status: DC | PRN

## 2015-08-01 NOTE — Progress Notes (Signed)
Pre visit review using our clinic review tool, if applicable. No additional management support is needed unless otherwise documented below in the visit note. 

## 2015-08-01 NOTE — Progress Notes (Signed)
Subjective:    Patient ID: [name], female    DOB: [birth year], 40 y.o.   MRN: [id number]  HPI  39YO female presents for acute visit.  Congestion - Started Thurs and Friday with sore throat and fever. Over weekend, developed cough and fever. Some productive cough with yellow mucous. Fever to 101.5 at home. Son was also recently sick with bronchitis.   Wt Readings from Last 3 Encounters:  08/01/15 143 lb 2 oz (64.921 kg)  11/28/14 141 lb 6 oz (64.127 kg)  09/07/12 143 lb 8 oz (65.091 kg)   BP Readings from Last 3 Encounters:  08/01/15 108/75  11/28/14 93/62  09/07/12 100/72    Past Medical History  Diagnosis Date  . Migraine     menstrual related   Family History  Problem Relation Age of Onset  . Arthritis Mother     s/p knee replacement  . Hypertension Mother   . Migraines Father   . Hypertension Father   . Migraines Sister    Past Surgical History  Procedure Laterality Date  . Cesarean section  01/02/2010    repeat C-section  . Cesarean section  02/01/2005    postdates   Social History   Social History  . Marital Status: Married    Spouse Name: N/A  . Number of Children: N/A  . Years of Education: N/A   Social History Main Topics  . Smoking status: Never Smoker   . Smokeless tobacco: None  . Alcohol Use: None  . Drug Use: None  . Sexual Activity: Not Asked   Other Topics Concern  . None   Social History Narrative    Review of Systems  Constitutional: Positive for fever, chills and fatigue. Negative for unexpected weight change.  HENT: Positive for congestion, postnasal drip, rhinorrhea and sore throat. Negative for ear discharge, ear pain, facial swelling, hearing loss, mouth sores, nosebleeds, sinus pressure, sneezing, tinnitus, trouble swallowing and voice change.   Eyes: Negative for pain, discharge, redness and visual disturbance.  Respiratory: Positive for cough. Negative for chest tightness, shortness of breath, wheezing and stridor.     Cardiovascular: Negative for chest pain, palpitations and leg swelling.  Gastrointestinal: Negative for nausea, vomiting, diarrhea and constipation.  Musculoskeletal: Negative for myalgias, arthralgias, neck pain and neck stiffness.  Skin: Negative for color change and rash.  Neurological: Negative for dizziness, weakness, light-headedness and headaches.  Hematological: Negative for adenopathy.  Psychiatric/Behavioral: Positive for sleep disturbance.       Objective:    BP 108/75 mmHg  Pulse 73  Temp(Src) 98.2 F (36.8 C) (Oral)  Ht  (1.6 m)  Wt 143 lb 2 oz (64.921 kg)  BMI 25.36 kg/m2  SpO2 97% Physical Exam  Constitutional: She is oriented to person, place, and time. She appears well-developed and well-nourished. No distress.  HENT:  Head: Normocephalic and atraumatic.  Right Ear: External ear normal.  Left Ear: External ear normal.  Nose: Mucosal edema and rhinorrhea present.  Mouth/Throat: Oropharynx is clear and moist. No oropharyngeal exudate or posterior oropharyngeal erythema.  Eyes: Conjunctivae are normal. Pupils are equal, round, and reactive to light. Right eye exhibits no discharge. Left eye exhibits no discharge. No scleral icterus.  Neck: Normal range of motion. Neck supple. No tracheal deviation present. No thyromegaly present.  Cardiovascular: Normal rate, regular rhythm, normal heart sounds and intact distal pulses.  Exam reveals no gallop and no friction rub.   No murmur heard. Pulmonary/Chest: Effort normal. No accessory muscle usage.  No tachypnea. No respiratory distress. She has no decreased breath sounds. She has no wheezes. She has rhonchi (few scattered). She has no rales. She exhibits no tenderness.  Musculoskeletal: Normal range of motion. She exhibits no edema or tenderness.  Lymphadenopathy:    She has no cervical adenopathy.  Neurological: She is alert and oriented to person, place, and time. No cranial nerve deficit. She exhibits normal muscle  tone. Coordination normal.  Skin: Skin is warm and dry. No rash noted. She is not diaphoretic. No erythema. No pallor.  Psychiatric: She has a normal mood and affect. Her behavior is normal. Judgment and thought content normal.          Assessment & Plan:   Problem List Items Addressed This Visit      Unprioritized   Viral URI with cough - Primary    Symptoms c/w viral URI with cough. Encouraged rest, adequate fluids. PRN Mucinex, Tylenol. Add Tussionex as needed for cough. Discussed that this medication may make her drowsy. If any recurrent fever this week, discussed getting CXR for evaluation. We also discussed that cough may persist for 4 weeks.       Relevant Medications   chlorpheniramine-HYDROcodone (TUSSIONEX PENNKINETIC ER) 10-8 MG/5ML SUER       Return if symptoms worsen or fail to improve.

## 2015-08-01 NOTE — Assessment & Plan Note (Signed)
Symptoms c/w viral URI with cough. Encouraged rest, adequate fluids. PRN Mucinex, Tylenol. Add Tussionex as needed for cough. Discussed that this medication may make her drowsy. If any recurrent fever this week, discussed getting CXR for evaluation. We also discussed that cough may persist for 4 weeks.

## 2015-08-01 NOTE — Patient Instructions (Signed)
Start Tussionex as needed for cough. If ANY fever, please call or email and we will order a chest xray.  Upper Respiratory Infection, Adult Most upper respiratory infections (URIs) are a viral infection of the air passages leading to the lungs. A URI affects the nose, throat, and upper air passages. The most common type of URI is nasopharyngitis and is typically referred to as "the common cold." URIs run their course and usually go away on their own. Most of the time, a URI does not require medical attention, but sometimes a bacterial infection in the upper airways can follow a viral infection. This is called a secondary infection. Sinus and middle ear infections are common types of secondary upper respiratory infections. Bacterial pneumonia can also complicate a URI. A URI can worsen asthma and chronic obstructive pulmonary disease (COPD). Sometimes, these complications can require emergency medical care and may be life threatening.  CAUSES Almost all URIs are caused by viruses. A virus is a type of germ and can spread from one person to another.  RISKS FACTORS You may be at risk for a URI if:   You smoke.   You have chronic heart or lung disease.  You have a weakened defense (immune) system.   You are very young or very old.   You have nasal allergies or asthma.  You work in crowded or poorly ventilated areas.  You work in health care facilities or schools. SIGNS AND SYMPTOMS  Symptoms typically develop 2-3 days after you come in contact with a cold virus. Most viral URIs last 7-10 days. However, viral URIs from the influenza virus (flu virus) can last 14-18 days and are typically more severe. Symptoms may include:   Runny or stuffy (congested) nose.   Sneezing.   Cough.   Sore throat.   Headache.   Fatigue.   Fever.   Loss of appetite.   Pain in your forehead, behind your eyes, and over your cheekbones (sinus pain).  Muscle aches.  DIAGNOSIS  Your health  care provider may diagnose a URI by:  Physical exam.  Tests to check that your symptoms are not due to another condition such as:  Strep throat.  Sinusitis.  Pneumonia.  Asthma. TREATMENT  A URI goes away on its own with time. It cannot be cured with medicines, but medicines may be prescribed or recommended to relieve symptoms. Medicines may help:  Reduce your fever.  Reduce your cough.  Relieve nasal congestion. HOME CARE INSTRUCTIONS   Take medicines only as directed by your health care provider.   Gargle warm saltwater or take cough drops to comfort your throat as directed by your health care provider.  Use a warm mist humidifier or inhale steam from a shower to increase air moisture. This may make it easier to breathe.  Drink enough fluid to keep your urine clear or pale yellow.   Eat soups and other clear broths and maintain good nutrition.   Rest as needed.   Return to work when your temperature has returned to normal or as your health care provider advises. You may need to stay home longer to avoid infecting others. You can also use a face mask and careful hand washing to prevent spread of the virus.  Increase the usage of your inhaler if you have asthma.   Do not use any tobacco products, including cigarettes, chewing tobacco, or electronic cigarettes. If you need help quitting, ask your health care provider. PREVENTION  The best way to protect  yourself from getting a cold is to practice good hygiene.   Avoid oral or hand contact with people with cold symptoms.   Wash your hands often if contact occurs.  There is no clear evidence that vitamin C, vitamin E, echinacea, or exercise reduces the chance of developing a cold. However, it is always recommended to get plenty of rest, exercise, and practice good nutrition.  SEEK MEDICAL CARE IF:   You are getting worse rather than better.   Your symptoms are not controlled by medicine.   You have  chills.  You have worsening shortness of breath.  You have brown or red mucus.  You have yellow or brown nasal discharge.  You have pain in your face, especially when you bend forward.  You have a fever.  You have swollen neck glands.  You have pain while swallowing.  You have white areas in the back of your throat. SEEK IMMEDIATE MEDICAL CARE IF:   You have severe or persistent:  Headache.  Ear pain.  Sinus pain.  Chest pain.  You have chronic lung disease and any of the following:  Wheezing.  Prolonged cough.  Coughing up blood.  A change in your usual mucus.  You have a stiff neck.  You have changes in your:  Vision.  Hearing.  Thinking.  Mood. MAKE SURE YOU:   Understand these instructions.  Will watch your condition.  Will get help right away if you are not doing well or get worse.   This information is not intended to replace advice given to you by your health care provider. Make sure you discuss any questions you have with your health care provider.   Document Released: 12/25/2000 Document Revised: 11/15/2014 Document Reviewed: 10/06/2013 Elsevier Interactive Patient Education [employer].

## 2015-09-19 LAB — HM MAMMOGRAPHY: HM MAMMO: NEGATIVE

## 2015-09-20 ENCOUNTER — Encounter: Payer: Self-pay | Admitting: Internal Medicine

## 2015-09-20 ENCOUNTER — Ambulatory Visit (INDEPENDENT_AMBULATORY_CARE_PROVIDER_SITE_OTHER): Payer: BLUE CROSS/BLUE SHIELD | Admitting: Internal Medicine

## 2015-09-20 VITALS — BP 100/61 | HR 79 | Temp 98.2°F | Ht 63.0 in | Wt 143.4 lb

## 2015-09-20 DIAGNOSIS — M542 Cervicalgia: Secondary | ICD-10-CM

## 2015-09-20 DIAGNOSIS — E079 Disorder of thyroid, unspecified: Secondary | ICD-10-CM

## 2015-09-20 DIAGNOSIS — R5382 Chronic fatigue, unspecified: Secondary | ICD-10-CM | POA: Diagnosis not present

## 2015-09-20 NOTE — Progress Notes (Signed)
Pre visit review using our clinic review tool, if applicable. No additional management support is needed unless otherwise documented below in the visit note. 

## 2015-09-20 NOTE — Patient Instructions (Signed)
Labs today  Follow up 2 weeks

## 2015-09-20 NOTE — Progress Notes (Signed)
Subjective:    Patient ID: [name], female    DOB: 1976/02/06, 40 y.o.   MRN: [id number]  HPI  39YO female presents for follow up.  Feeling exhausted. Goes to bed 9pm and wakes 6am. Feels okay at work during day. Exercising regularly. Stopped injection of B12. Taking B12 oral supplement, Vit D supplement and Biotin.  Also notes some bilateral neck pain and back pain at times after yoga and at times after work. Would like to try massage therapy for this.  Wt Readings from Last 3 Encounters:  09/20/15 143 lb 6 oz (65.034 kg)  08/01/15 143 lb 2 oz (64.921 kg)  11/28/14 141 lb 6 oz (64.127 kg)   BP Readings from Last 3 Encounters:  09/20/15 100/61  08/01/15 108/75  11/28/14 93/62    Past Medical History  Diagnosis Date  . Migraine     menstrual related   Family History  Problem Relation Age of Onset  . Arthritis Mother     s/p knee replacement  . Hypertension Mother   . Migraines Father   . Hypertension Father   . Migraines Sister    Past Surgical History  Procedure Laterality Date  . Cesarean section  01/02/2010    repeat C-section  . Cesarean section  02/01/2005    postdates   Social History   Social History  . Marital Status: Married    Spouse Name: N/A  . Number of Children: N/A  . Years of Education: N/A   Social History Main Topics  . Smoking status: Never Smoker   . Smokeless tobacco: None  . Alcohol Use: None  . Drug Use: None  . Sexual Activity: Not Asked   Other Topics Concern  . None   Social History Narrative    Review of Systems  Constitutional: Positive for fatigue. Negative for fever, chills, appetite change and unexpected weight change.  Eyes: Negative for visual disturbance.  Respiratory: Negative for cough and shortness of breath.   Cardiovascular: Negative for chest pain and leg swelling.  Gastrointestinal: Negative for nausea, vomiting, abdominal pain, diarrhea and constipation.  Musculoskeletal: Positive for myalgias,  arthralgias and neck pain.  Skin: Negative for color change and rash.  Hematological: Negative for adenopathy. Does not bruise/bleed easily.  Psychiatric/Behavioral: Negative for sleep disturbance and dysphoric mood. The patient is not nervous/anxious.        Objective:    BP 100/61 mmHg  Pulse 79  Temp(Src) 98.2 F (36.8 C) (Oral)  Ht 5\' 3"  (1.6 m)  Wt 143 lb 6 oz (65.034 kg)  BMI 25.40 kg/m2  SpO2 97%  LMP 08/26/2015 Physical Exam  Constitutional: She is oriented to person, place, and time. She appears well-developed and well-nourished. No distress.  HENT:  Head: Normocephalic and atraumatic.  Right Ear: External ear normal.  Left Ear: External ear normal.  Nose: Nose normal.  Mouth/Throat: Oropharynx is clear and moist. No oropharyngeal exudate.  Eyes: Conjunctivae are normal. Pupils are equal, round, and reactive to light. Right eye exhibits no discharge. Left eye exhibits no discharge. No scleral icterus.  Neck: Normal range of motion. Muscular tenderness present. Rigidity present. No tracheal deviation present. No thyromegaly present.  Cardiovascular: Normal rate, regular rhythm, normal heart sounds and intact distal pulses.  Exam reveals no gallop and no friction rub.   No murmur heard. Pulmonary/Chest: Effort normal and breath sounds normal. No respiratory distress. She has no wheezes. She has no rales. She exhibits no tenderness.  Musculoskeletal: Normal range of  motion. She exhibits no edema or tenderness.  Lymphadenopathy:    She has no cervical adenopathy.  Neurological: She is alert and oriented to person, place, and time. No cranial nerve deficit. She exhibits normal muscle tone. Coordination normal.  Skin: Skin is warm and dry. No rash noted. She is not diaphoretic. No erythema. No pallor.  Psychiatric: She has a normal mood and affect. Her behavior is normal. Judgment and thought content normal.          Assessment & Plan:   Problem List Items Addressed  This Visit      Unprioritized   Chronic fatigue - Primary    Chronic mild fatigue, as described seems normal. She is tired just before bed. No daytime fatigue. Will check CBC, CMP, ferritin, TSH, Vit D, and B12. Follow up after labs.      Relevant Orders   TSH   CBC with Differential/Platelet   Comprehensive metabolic panel   VITAMIN D 25 Hydroxy (Vit-D Deficiency, Fractures)   Ferritin   B12   T4, free   Neck pain    Bilateral neck and back pain. Likely muscular strain with prolonged computer use. Rx written for massage therapy at her request. Recommended PT, which she declines for now.      Thyroid dysfunction    Reviewed recent thyroid labs from 2016 which were normal. Will repeat TSH and Free T4 with labs today.          Return in about 2 weeks (around 10/04/2015) for Recheck.  Ronna Polio, MD Internal Medicine [facility]

## 2015-09-20 NOTE — Assessment & Plan Note (Signed)
Reviewed recent thyroid labs from 2016 which were normal. Will repeat TSH and Free T4 with labs today.

## 2015-09-20 NOTE — Assessment & Plan Note (Signed)
Chronic mild fatigue, as described seems normal. She is tired just before bed. No daytime fatigue. Will check CBC, CMP, ferritin, TSH, Vit D, and B12. Follow up after labs.

## 2015-09-20 NOTE — Assessment & Plan Note (Signed)
Bilateral neck and back pain. Likely muscular strain with prolonged computer use. Rx written for massage therapy at her request. Recommended PT, which she declines for now.

## 2015-09-21 ENCOUNTER — Encounter: Payer: Self-pay | Admitting: Internal Medicine

## 2015-09-21 ENCOUNTER — Telehealth: Payer: Self-pay

## 2015-09-21 LAB — TSH: TSH: 2.94 u[IU]/mL (ref 0.35–4.50)

## 2015-09-21 LAB — COMPREHENSIVE METABOLIC PANEL
ALT: 19 U/L (ref 0–35)
AST: 19 U/L (ref 0–37)
Albumin: 4.8 g/dL (ref 3.5–5.2)
Alkaline Phosphatase: 65 U/L (ref 39–117)
BILIRUBIN TOTAL: 0.4 mg/dL (ref 0.2–1.2)
BUN: 10 mg/dL (ref 6–23)
CALCIUM: 9.8 mg/dL (ref 8.4–10.5)
CHLORIDE: 99 meq/L (ref 96–112)
CO2: 27 meq/L (ref 19–32)
Creatinine, Ser: 0.61 mg/dL (ref 0.40–1.20)
GFR: 116.9 mL/min (ref >60.00)
GLUCOSE: 38 mg/dL — AB (ref 70–99)
POTASSIUM: 4.1 meq/L (ref 3.5–5.1)
Sodium: 137 mEq/L (ref 135–145)
Total Protein: 7.9 g/dL (ref 6.0–8.3)

## 2015-09-21 LAB — CBC WITH DIFFERENTIAL/PLATELET
BASOS ABS: 0 10*3/uL (ref 0.0–0.1)
BASOS PCT: 0.2 % (ref 0.0–3.0)
Eosinophils Absolute: 0.2 10*3/uL (ref 0.0–0.7)
Eosinophils Relative: 3 % (ref 0.0–5.0)
HCT: 37.6 % (ref 36.0–46.0)
Hemoglobin: 12.7 g/dL (ref 12.0–15.0)
LYMPHS ABS: 2.3 10*3/uL (ref 0.7–4.0)
Lymphocytes Relative: 36.1 % (ref 12.0–46.0)
MCHC: 33.7 g/dL (ref 30.0–36.0)
MCV: 84.6 fl (ref 78.0–100.0)
MONOS PCT: 7.7 % (ref 3.0–12.0)
Monocytes Absolute: 0.5 10*3/uL (ref 0.1–1.0)
NEUTROS ABS: 3.4 10*3/uL (ref 1.4–7.7)
NEUTROS PCT: 53 % (ref 43.0–77.0)
PLATELETS: 242 10*3/uL (ref 150.0–400.0)
RBC: 4.44 Mil/uL (ref 3.87–5.11)
RDW: 12.9 % (ref 11.5–15.5)
WBC: 6.4 10*3/uL (ref 4.0–10.5)

## 2015-09-21 LAB — VITAMIN B12: VITAMIN B 12: 684 pg/mL (ref 211–911)

## 2015-09-21 LAB — VITAMIN D 25 HYDROXY (VIT D DEFICIENCY, FRACTURES): VITD: 23.97 ng/mL — ABNORMAL LOW (ref 30.00–100.00)

## 2015-09-21 LAB — T4, FREE: FREE T4: 0.84 ng/dL (ref 0.60–1.60)

## 2015-09-21 LAB — FERRITIN: FERRITIN: 19.1 ng/mL (ref 10.0–291.0)

## 2015-09-21 NOTE — Telephone Encounter (Signed)
Noted  

## 2015-09-21 NOTE — Telephone Encounter (Signed)
Critical Result: pt's Glucose was 38. Please advise, thanks

## 2015-09-21 NOTE — Telephone Encounter (Signed)
This is likely because the blood sat overnight. No further action needed.

## 2015-11-21 ENCOUNTER — Telehealth: Payer: Self-pay | Admitting: Internal Medicine

## 2015-11-21 NOTE — Telephone Encounter (Signed)
Last seen on 09/20/15, please advise if you want her to have an appt?

## 2015-11-21 NOTE — Telephone Encounter (Signed)
Pt called and wanted to speak to Dr. Dan HumphreysWalker. She has a sore throat. Pt states that sometimes Dr. Dan HumphreysWalker wants to see her and other times she calls something in. Pt would not make appointment until she speaks with Dr. Dan HumphreysWalker.

## 2015-11-21 NOTE — Telephone Encounter (Signed)
Needs to be seen. May have to be another provider, as I am already overbooked at lunch tomorrow

## 2015-11-21 NOTE — Telephone Encounter (Signed)
Needs to be seen, can be with another provider, thanks

## 2017-12-04 ENCOUNTER — Encounter: Payer: BLUE CROSS/BLUE SHIELD | Admitting: Family Medicine

## 2017-12-04 ENCOUNTER — Ambulatory Visit: Payer: BLUE CROSS/BLUE SHIELD | Admitting: Family Medicine

## 2017-12-11 ENCOUNTER — Ambulatory Visit: Payer: BLUE CROSS/BLUE SHIELD | Admitting: Family Medicine

## 2017-12-11 ENCOUNTER — Encounter: Payer: Self-pay | Admitting: Family Medicine

## 2017-12-11 VITALS — BP 100/50 | HR 75 | Temp 98.6°F | Ht 63.25 in | Wt 133.0 lb

## 2017-12-11 DIAGNOSIS — Z8639 Personal history of other endocrine, nutritional and metabolic disease: Secondary | ICD-10-CM

## 2017-12-11 DIAGNOSIS — M545 Low back pain, unspecified: Secondary | ICD-10-CM

## 2017-12-11 DIAGNOSIS — Z634 Disappearance and death of family member: Secondary | ICD-10-CM

## 2017-12-11 DIAGNOSIS — Z7689 Persons encountering health services in other specified circumstances: Secondary | ICD-10-CM

## 2017-12-11 DIAGNOSIS — L659 Nonscarring hair loss, unspecified: Secondary | ICD-10-CM | POA: Diagnosis not present

## 2017-12-11 LAB — COMPREHENSIVE METABOLIC PANEL
ALBUMIN: 4.3 g/dL (ref 3.5–5.2)
ALK PHOS: 52 U/L (ref 39–117)
ALT: 13 U/L (ref 0–35)
AST: 13 U/L (ref 0–37)
BUN: 8 mg/dL (ref 6–23)
CHLORIDE: 105 meq/L (ref 96–112)
CO2: 28 mEq/L (ref 19–32)
Calcium: 9.6 mg/dL (ref 8.4–10.5)
Creatinine, Ser: 0.6 mg/dL (ref 0.40–1.20)
GFR: 117.77 mL/min (ref >60.00)
Glucose, Bld: 92 mg/dL (ref 70–99)
POTASSIUM: 4 meq/L (ref 3.5–5.1)
Sodium: 140 mEq/L (ref 135–145)
TOTAL PROTEIN: 6.9 g/dL (ref 6.0–8.3)
Total Bilirubin: 0.3 mg/dL (ref 0.2–1.2)

## 2017-12-11 LAB — CBC
HEMATOCRIT: 35.7 % — AB (ref 36.0–46.0)
HEMOGLOBIN: 12.3 g/dL (ref 12.0–15.0)
MCHC: 34.5 g/dL (ref 30.0–36.0)
MCV: 85.9 fl (ref 78.0–100.0)
Platelets: 201 10*3/uL (ref 150.0–400.0)
RBC: 4.16 Mil/uL (ref 3.87–5.11)
RDW: 12.7 % (ref 11.5–15.5)
WBC: 5.7 10*3/uL (ref 4.0–10.5)

## 2017-12-11 LAB — VITAMIN D 25 HYDROXY (VIT D DEFICIENCY, FRACTURES): VITD: 15.66 ng/mL — AB (ref 30.00–100.00)

## 2017-12-11 LAB — VITAMIN B12: Vitamin B-12: 398 pg/mL (ref 211–911)

## 2017-12-11 LAB — TSH: TSH: 2.22 u[IU]/mL (ref 0.35–4.50)

## 2017-12-11 NOTE — Progress Notes (Signed)
HPI:  [name] is here to establish care. Recently moved back to [place]. Several concerns today.  Hx chronic fatigue, mild hypothyroidism, B12 def, low Vit D per brief review remote OV reports form Dr. Dan Humphreys 2016. Last records available form 2017.  Hair thinning: -started with stress from losing her mother last year -feels hair has thinned -other wise feels well -avoiding harsh products - uses henna  Low back pain: -started 2 weeks ago after 2 week trip/break and then starting back to crossfit activities -bilateral low back/upper buttocks -no reported weakness, numbness, BB dsyfunction  ROS negative for unless reported above: fevers, unintentional weight loss (reports intentional wt reduction with increased exercise and improving diet, hearing or vision loss, chest pain, palpitations, struggling to breath, hemoptysis, melena, hematochezia, hematuria, falls, loc, si, thoughts of self harm  Past Medical History:  Diagnosis Date  . Migraine    menstrual related  . Migraine     Past Surgical History:  Procedure Laterality Date  . CESAREAN SECTION  01/02/2010   repeat C-section  . CESAREAN SECTION  02/01/2005   postdates    Family History  Problem Relation Age of Onset  . Arthritis Mother        s/p knee replacement  . Hypertension Mother   . Liver cancer Mother 52  . Pancreatic cancer Mother 66  . Migraines Father   . Hypertension Father   . Migraines Sister     Social History   Socioeconomic History  . Marital status: Married    Spouse name: Not on file  . Number of children: Not on file  . Years of education: Not on file  . Highest education level: Not on file  Occupational History  . Not on file  Social Needs  . Financial resource strain: Not on file  . Food insecurity:    Worry: Not on file    Inability: Not on file  . Transportation needs:    Medical: Not on file    Non-medical: Not on file  Tobacco Use  . Smoking status: Never Smoker  .  Smokeless tobacco: Never Used  Substance and Sexual Activity  . Alcohol use: Not on file  . Drug use: Not on file  . Sexual activity: Not on file  Lifestyle  . Physical activity:    Days per week: Not on file    Minutes per session: Not on file  . Stress: Not on file  Relationships  . Social connections:    Talks on phone: Not on file    Gets together: Not on file    Attends religious service: Not on file    Active member of club or organization: Not on file    Attends meetings of clubs or organizations: Not on file    Relationship status: Not on file  Other Topics Concern  . Not on file  Social History Narrative   Work or School: Location manager      Home Situation:lives with huaband and 2 children 90 yo G and 8 yo B in 2019      Spiritual Beliefs:hindu      Lifestyle:regular exercise   Vegetarian    No current outpatient medications on file.  EXAM:  Vitals:   12/11/17 1400  BP: (!) 100/50  Pulse: 75  Temp: 98.6 F (37 C)    Body mass index is 23.37 kg/m.  GENERAL: vitals reviewed and listed above, alert, oriented, appears well hydrated and in no acute distress  HEENT: atraumatic, conjunttiva clear, no obvious abnormalities on inspection of external nose and ears  NECK: no obvious masses on inspection  LUNGS: clear to auscultation bilaterally, no wheezes, rales or rhonchi, good air movement  CV: HRRR, no peripheral edema  MS: moves all extremities without noticeable abnormality, TTP upper glutes bilat, no bony TTP, gait normal, LE grossly intact  SKIN/HAIR: skin appears normal, pull test neg, no balding or scalp abnormalities on exam  PSYCH: pleasant and cooperative, no obvious depression or anxiety  ASSESSMENT AND PLAN:  Discussed the following assessment and plan:  Hair loss - Plan: CBC, VITAMIN D 25 Hydroxy (Vit-D Deficiency, Fractures), Vitamin B12, TSH, Comprehensive metabolic panel -tx per results -tx stress with CBT -consider referral to  [facility] if persists  Bereavement -CBT, she has this free at work and will consider  History of non anemic vitamin B12 deficiency -check labs, vegetarian, likely needs daily b12 supplementation, discussed consumer lab review products  Acute bilateral low back pain without sciatica -HEP, further eval if persists, consdier OMT  Encounter to establish care -We reviewed the PMH, PSH, FH, SH, Meds and Allergies. -We addressed current concerns per orders and patient instructions. -We have advised patient to follow up per instructions below.   -Patient advised to return or notify a doctor immediately if symptoms worsen or persist or new concerns arise.  Patient Instructions  BEFORE YOU LEAVE: -new patient paper work and update in epic FH, personal hx and SH -low back exercises -labs -follow up: 3 months for CPE with pap - come fasting Sooner as needed if back pain persists.  Do the exercises 4 days per week. If back pain persists in 1 week schedule osteopathic exam/treatment.  Heat and topical menthol can help with the back pain - tiger balm is a good topical menthol product available over the counter.  Consider counseling and nutritional counseling at work.  We have ordered labs or studies at this visit. It can take up to 1-2 weeks for results and processing. IF results require follow up or explanation, we will call you with instructions. Clinically stable results will be released to your [facility]. If you have not heard from [place] or cannot find your results in [facility] in 2 weeks please contact our office at [phone].  If you are not yet signed up for [facility], please consider signing up.            Terressa Koyanagi

## 2017-12-11 NOTE — Patient Instructions (Addendum)
BEFORE YOU LEAVE: -new patient paper work and update in epic FH, personal hx and SH -low back exercises -labs -follow up: 3 months for CPE with pap - come fasting Sooner as needed if back pain persists.  Do the exercises 4 days per week. If back pain persists in 1 week schedule osteopathic exam/treatment.  Heat and topical menthol can help with the back pain - tiger balm is a good topical menthol product available over the counter.  Consider counseling and nutritional counseling at work.  We have ordered labs or studies at this visit. It can take up to 1-2 weeks for results and processing. IF results require follow up or explanation, we will call you with instructions. Clinically stable results will be released to your [facility]. If you have not heard from [place] or cannot find your results in [facility] in 2 weeks please contact our office at [phone].  If you are not yet signed up for [facility], please consider signing up.

## 2018-01-02 ENCOUNTER — Telehealth: Payer: Self-pay | Admitting: Family Medicine

## 2018-01-02 NOTE — Telephone Encounter (Signed)
Form has not yet been received.

## 2018-01-02 NOTE — Telephone Encounter (Signed)
Copied from CRM [phone]. Topic: Quick Communication - See Telephone Encounter >> Jan 02, 2018 12:33 PM Floria RavelingStovall, Shana A wrote: CRM for notification. See Telephone encounter for: 01/02/18.  Pt called stated that she is faxing over a form for her and her husband that for her employer.  She is requesting that it be filled out today.  Explain to her there is a turn around time.  She wanted me to send message to Dr Selena BattenKim to expedite.  She stated the instructions are on the form, where to fax it .  She is also requesting a call once it has been completed   Best number [phone]

## 2018-01-08 NOTE — Telephone Encounter (Signed)
Please disregard previous note-form was attached to the pts husband's and placed on your desk.

## 2018-01-08 NOTE — Telephone Encounter (Signed)
I left a detailed message with the information below at the pts cell number.  Form faxed to [phone].

## 2018-01-08 NOTE — Telephone Encounter (Signed)
Note sent in error-form was not received for this pt.

## 2018-01-08 NOTE — Telephone Encounter (Signed)
Gina JonesCarolyn, This patient needs a call to apologize for the delay with their forms.   They need to know that Dr. Selena Hendrix was informed about their request today and got the forms TODAY and completed them today. I am not sure what the delay was in the interim as I did not know anything about them until today.  Can we please check into why there was such a delay so that we can do better in the future. The information for completion is in the chart - only needs signature from me which is very easy to provide if someone brings it to me.   Forms signed and back to you Gina CollumJo Hendrix. Please fill out according to recent visit and labs and fax.

## 2018-01-08 NOTE — Telephone Encounter (Signed)
Form received today and placed on your desk.

## 2018-01-20 ENCOUNTER — Ambulatory Visit: Payer: BLUE CROSS/BLUE SHIELD | Admitting: Family Medicine

## 2018-01-20 DIAGNOSIS — Z0289 Encounter for other administrative examinations: Secondary | ICD-10-CM

## 2018-01-26 NOTE — Progress Notes (Signed)
HPI:  Using dictation device. Unfortunately this device frequently misinterprets words/phrases.  Acute visit for several issues:  Low back pain: -started in may 2019 after 2 week trip/break and then starting back to crossfit activities; today reports she thinks it actually has been going on for 8 months -bilateral low back pain that is "very mild", 2/10 but constant, worse with ext of back - has not needed treatment, but asks about massage, OMM, chiropractic treatment -doing yoga, crossfit and running -better with yoga -no reported weakness, numbness, BB dsyfunction, malaise, unexplained wt loss  Bump on back: -Some time, 3 to 4 years -Desires removal as it is cosmetically undesirable when she wears her [place] outfits   ROS: See pertinent positives and negatives per HPI.  Past Medical History:  Diagnosis Date  . Migraine    menstrual related  . Migraine     Past Surgical History:  Procedure Laterality Date  . CESAREAN SECTION  01/02/2010   repeat C-section  . CESAREAN SECTION  02/01/2005   postdates    Family History  Problem Relation Age of Onset  . Arthritis Mother        s/p knee replacement  . Hypertension Mother   . Liver cancer Mother 25  . Pancreatic cancer Mother 34  . Migraines Father   . Hypertension Father   . Migraines Sister     SOCIAL HX: see hpi  No current outpatient medications on file.  EXAM:  Vitals:   01/27/18 1527  BP: 92/62  Pulse: 66  Temp: 98.5 F (36.9 C)    Body mass index is 23.16 kg/m.  GENERAL: vitals reviewed and listed above, alert, oriented, appears well hydrated and in no acute distress  HEENT: atraumatic, conjunttiva clear, no obvious abnormalities on inspection of external nose and ears  NECK: no obvious masses on inspection  SKIN: soft subcut mass upper R back with central puntate, nontender, ~ 2 cm in diameter  MS: moves all extremities without noticeable abnormality, normal gait, normal inspection of the low  back, hips and lower extremities, normal strength and sensitivity throughout the lower extremities, negative straight leg raising test, negative cross leg raising test, negative FABER/FADIR, TTP R>L lumbar paraspinal muscles, R SI TTP, NV intact distal  PSYCH: pleasant and cooperative, no obvious depression or anxiety  ASSESSMENT AND PLAN:  Discussed the following assessment and plan: More than 50% of over 25 minutes spent in total in caring for this patient was spent face-to-face with the patient, counseling and/or coordinating care.   Cyst of skin -discussed potential etiologies and tx -she desires removal, advised derm evaluation for assistance given location and size - she agrees to call for appt - brochure provided  Chronic bilateral low back pain without sciatica - Plan: DG Lumbar Spine Complete -we discussed possible serious and likely etiologies, workup and treatment, treatment risks and return precautions -- suspect strain related to activities -after this discussion, Braydee opted for, plain films today, consideration of OMM, massage or other conservative treatment options if plain films unrevealing, evaluation with specialist if revealing for a persistent symptoms despite treatment -follow up advised pending x-ray results -of course, we advised Aamani  to return or notify a doctor immediately if symptoms worsen or persist or new concerns arise.  .  -Patient advised to return or notify a doctor immediately if symptoms worsen or persist or new concerns arise.  Patient Instructions  BEFORE YOU LEAVE: -xray -follow up: pending xray results  We have ordered an xray at this  visit. It can take up to 1 week for results and processing. If you have not heard from us or cannot find your results in [facility] in 1 week please contact our office at [phone].  If you are not yet signed up for [facility], please consider signing up.  Call dermatology for removal of the cyst on your back - see  brochure provided.           Terressa KoyanagiHannah R Kim, DO

## 2018-01-27 ENCOUNTER — Encounter: Payer: Self-pay | Admitting: Family Medicine

## 2018-01-27 ENCOUNTER — Ambulatory Visit: Payer: BLUE CROSS/BLUE SHIELD | Admitting: Family Medicine

## 2018-01-27 ENCOUNTER — Ambulatory Visit (INDEPENDENT_AMBULATORY_CARE_PROVIDER_SITE_OTHER): Payer: BLUE CROSS/BLUE SHIELD

## 2018-01-27 VITALS — BP 92/62 | HR 66 | Temp 98.5°F | Ht 63.25 in | Wt 131.8 lb

## 2018-01-27 DIAGNOSIS — M545 Low back pain, unspecified: Secondary | ICD-10-CM

## 2018-01-27 DIAGNOSIS — G8929 Other chronic pain: Secondary | ICD-10-CM

## 2018-01-27 DIAGNOSIS — L729 Follicular cyst of the skin and subcutaneous tissue, unspecified: Secondary | ICD-10-CM

## 2018-01-27 NOTE — Patient Instructions (Signed)
BEFORE YOU LEAVE: -xray -follow up: pending xray results  We have ordered an xray at this visit. It can take up to 1 week for results and processing. If you have not heard from us or cannot find your results in [facility] in 1 week please contact our office at [phone].  If you are not yet signed up for [facility], please consider signing up.  Call dermatology for removal of the cyst on your back - see brochure provided.

## 2018-03-16 NOTE — Progress Notes (Deleted)
HPI:  Using dictation device. Unfortunately this device frequently misinterprets words/phrases.  Here for CPE:  -Concerns and/or follow up today: none ***  Chronic medical problems summarized below were reviewed for changes.***.   -Diet: variety of foods, balance and well rounded, larger portion sizes -Exercise: no regular exercise -Taking folic acid, vitamin D or calcium: no -Diabetes and Dyslipidemia Screening: *** -Vaccines: see vaccine section EPIC -pap history: *** -FDLMP: see nursing notes -sexual activity: yes, female partner, no new partners -wants STI testing (Hep C if born [birth year]): no -FH breast, colon or ovarian ca: see FH Last mammogram: *** Last colon cancer screening: *** Breast Ca Risk Assessment: see family history and pt history DEXA (>/= 62): ***  -Alcohol, Tobacco, drug use: see social history  Review of Systems - no fevers, unintentional weight loss, vision loss, hearing loss, chest pain, sob, hemoptysis, melena, hematochezia, hematuria, genital discharge, changing or concerning skin lesions, bleeding, bruising, loc, thoughts of self harm or SI  Past Medical History:  Diagnosis Date  . Migraine    menstrual related  . Migraine     Past Surgical History:  Procedure Laterality Date  . CESAREAN SECTION  01/02/2010   repeat C-section  . CESAREAN SECTION  02/01/2005   postdates    Family History  Problem Relation Age of Onset  . Arthritis Mother        s/p knee replacement  . Hypertension Mother   . Liver cancer Mother 67  . Pancreatic cancer Mother 29  . Migraines Father   . Hypertension Father   . Migraines Sister     Social History   Socioeconomic History  . Marital status: Married    Spouse name: Not on file  . Number of children: Not on file  . Years of education: Not on file  . Highest education level: Not on file  Occupational History  . Not on file  Social Needs  . Financial resource strain: Not on file  . Food insecurity:   Worry: Not on file    Inability: Not on file  . Transportation needs:    Medical: Not on file    Non-medical: Not on file  Tobacco Use  . Smoking status: Never Smoker  . Smokeless tobacco: Never Used  Substance and Sexual Activity  . Alcohol use: Not on file  . Drug use: Not on file  . Sexual activity: Not on file  Lifestyle  . Physical activity:    Days per week: Not on file    Minutes per session: Not on file  . Stress: Not on file  Relationships  . Social connections:    Talks on phone: Not on file    Gets together: Not on file    Attends religious service: Not on file    Active member of club or organization: Not on file    Attends meetings of clubs or organizations: Not on file    Relationship status: Not on file  Other Topics Concern  . Not on file  Social History Narrative   Work or School: Location manager      Home Situation:lives with huaband and 2 children 34 yo G and 8 yo B in 2019      Spiritual Beliefs:hindu      Lifestyle:regular exercise   Vegetarian    No current outpatient medications on file.  EXAM:  There were no vitals filed for this visit.  GENERAL: vitals reviewed and listed below, alert, oriented, appears well hydrated and in  no acute distress  HEENT: head atraumatic, PERRLA, normal appearance of eyes, ears, nose and mouth. moist mucus membranes.  NECK: supple, no masses or lymphadenopathy  LUNGS: clear to auscultation bilaterally, no rales, rhonchi or wheeze  CV: HRRR, no peripheral edema or cyanosis, normal pedal pulses  ABDOMEN: bowel sounds normal, soft, non tender to palpation, no masses, no rebound or guarding  GU/BREAST: ***  SKIN: no rash or abnormal lesions  MS: normal gait, moves all extremities normally  NEURO: normal gait, speech and thought processing grossly intact, muscle tone grossly intact throughout  PSYCH: normal affect, pleasant and cooperative  ASSESSMENT AND PLAN:  Discussed the following assessment and  plan:  PREVENTIVE EXAM: -Discussed and advised all [place] preventive services health task force level A and B recommendations for age, sex and risks. -Advised at least 150 minutes of exercise per week and a healthy diet with avoidance of (less then 1 serving per week) processed foods, white starches, red meat, fast foods and sweets and consisting of: * 5-9 servings of fresh fruits and vegetables (not corn or potatoes) *nuts and seeds, beans *olives and olive oil *lean meats such as fish and white chicken  *whole grains -labs, studies and vaccines per orders this encounter  There are no diagnoses linked to this encounter. ***  Patient advised to return to clinic immediately if symptoms worsen or persist or new concerns.  There are no Patient Instructions on file for this visit.  No follow-ups on file.  Terressa Koyanagi, DO

## 2018-03-17 ENCOUNTER — Encounter: Payer: BLUE CROSS/BLUE SHIELD | Admitting: Family Medicine

## 2018-04-02 ENCOUNTER — Ambulatory Visit (INDEPENDENT_AMBULATORY_CARE_PROVIDER_SITE_OTHER): Payer: BLUE CROSS/BLUE SHIELD | Admitting: Family Medicine

## 2018-04-02 ENCOUNTER — Encounter: Payer: Self-pay | Admitting: Family Medicine

## 2018-04-02 ENCOUNTER — Other Ambulatory Visit (HOSPITAL_COMMUNITY)
Admission: RE | Admit: 2018-04-02 | Discharge: 2018-04-02 | Disposition: A | Discharge status: Home / Self Care | Payer: BLUE CROSS/BLUE SHIELD | Source: Ambulatory Visit | Attending: Family Medicine | Admitting: Family Medicine

## 2018-04-02 VITALS — BP 84/60 | HR 84 | Temp 98.6°F | Ht 63.75 in | Wt 130.9 lb

## 2018-04-02 DIAGNOSIS — Z1151 Encounter for screening for human papillomavirus (HPV): Secondary | ICD-10-CM | POA: Insufficient documentation

## 2018-04-02 DIAGNOSIS — Z0000 Encounter for general adult medical examination without abnormal findings: Secondary | ICD-10-CM

## 2018-04-02 DIAGNOSIS — Z124 Encounter for screening for malignant neoplasm of cervix: Secondary | ICD-10-CM | POA: Insufficient documentation

## 2018-04-02 DIAGNOSIS — Z23 Encounter for immunization: Secondary | ICD-10-CM | POA: Diagnosis not present

## 2018-04-02 NOTE — Patient Instructions (Addendum)
BEFORE YOU LEAVE: -flu shot -labs -follow up: OMM for back pain - asap per patient preference - please always check in 15 minutes prior to your appointment for your nurse and check in visit.  We have ordered labs or studies at this visit. It can take up to 1-2 weeks for results and processing. IF results require follow up or explanation, we will call you with instructions. Clinically stable results will be released to your [facility]. If you have not heard from [place] or cannot find your results in [facility] in 2 weeks please contact our office at [phone].  If you are not yet signed up for [facility], please consider signing up.   Preventive Care 18-39 Years, Female Preventive care refers to lifestyle choices and visits with your health care provider that can promote health and wellness. What does preventive care include?  A yearly physical exam. This is also called an annual well check.  Dental exams once or twice a year.  Routine eye exams. Ask your health care provider how often you should have your eyes checked.  Personal lifestyle choices, including: ? Daily care of your teeth and gums. ? Regular physical activity. ? Eating a healthy diet. ? Avoiding tobacco and drug use. ? Limiting alcohol use. ? Practicing safe sex. ? Taking vitamin and mineral supplements as recommended by your health care provider. What happens during an annual well check? The services and screenings done by your health care provider during your annual well check will depend on your age, overall health, lifestyle risk factors, and family history of disease. Counseling Your health care provider may ask you questions about your:  Alcohol use.  Tobacco use.  Drug use.  Emotional well-being.  Home and relationship well-being.  Sexual activity.  Eating habits.  Work and work Statistician.  Method of birth control.  Menstrual cycle.  Pregnancy history.  Screening You may have the following tests or  measurements:  Height, weight, and BMI.  Diabetes screening. This is done by checking your blood sugar (glucose) after you have not eaten for a while (fasting).  Blood pressure.  Lipid and cholesterol levels. These may be checked every 5 years starting at age 8.  Skin check.  Hepatitis C blood test.  Hepatitis B blood test.  Sexually transmitted disease (STD) testing.  BRCA-related cancer screening. This may be done if you have a family history of breast, ovarian, tubal, or peritoneal cancers.  Pelvic exam and Pap test. This may be done every 3 years starting at age 4. Starting at age 60, this may be done every 5 years if you have a Pap test in combination with an HPV test.  Discuss your test results, treatment options, and if necessary, the need for more tests with your health care provider. Vaccines Your health care provider may recommend certain vaccines, such as:  Influenza vaccine. This is recommended every year.  Tetanus, diphtheria, and acellular pertussis (Tdap, Td) vaccine. You may need a Td booster every 10 years.  Varicella vaccine. You may need this if you have not been vaccinated.  HPV vaccine. If you are 88 or younger, you may need three doses over 6 months.  Measles, mumps, and rubella (MMR) vaccine. You may need at least one dose of MMR. You may also need a second dose.  Pneumococcal 13-valent conjugate (PCV13) vaccine. You may need this if you have certain conditions and were not previously vaccinated.  Pneumococcal polysaccharide (PPSV23) vaccine. You may need one or two doses if you  smoke cigarettes or if you have certain conditions.  Meningococcal vaccine. One dose is recommended if you are age 13-21 years and a first-year college student living in a residence hall, or if you have one of several medical conditions. You may also need additional booster doses.  Hepatitis A vaccine. You may need this if you have certain conditions or if you travel or work  in places where you may be exposed to hepatitis A.  Hepatitis B vaccine. You may need this if you have certain conditions or if you travel or work in places where you may be exposed to hepatitis B.  Haemophilus influenzae type b (Hib) vaccine. You may need this if you have certain risk factors.  Talk to your health care provider about which screenings and vaccines you need and how often you need them. This information is not intended to replace advice given to you by your health care provider. Make sure you discuss any questions you have with your health care provider. Document Released: 08/27/2001 Document Revised: 03/20/2016 Document Reviewed: 05/02/2015 Elsevier Interactive Patient Education  [employer].

## 2018-04-02 NOTE — Addendum Note (Signed)
Addended by: Johnella MoloneyFUNDERBURK, Myrla Malanowski A on: 04/02/2018 04:06 PM   Modules accepted: Orders

## 2018-04-02 NOTE — Progress Notes (Signed)
HPI:  Using dictation device. Unfortunately this device frequently misinterprets words/phrases.  [name] is a pleasant 42 year old, recently new to our practice, here for CPE:  -Concerns and/or follow up today:  Due for Pap smear, cholesterol, diabetes screening and flu shot.  He has a reported past medical history significant for chronic fatigue, mild hypothyroidism, B12 deficiency and low vitamin D.  Her vitamin B12 level and thyroid levels were normal in May.  Vitamin D was mildly low and we advised supplementation. Doing well. Working up to 30 minutes of running, healthy diet.  Wants to schedule OMM for chronic back pain.  -Diet: variety of foods, balance and well rounded, larger portion sizes -Exercise: no regular exercise -Taking folic acid, vitamin D or calcium: no -Diabetes and Dyslipidemia Screening: not fasting -Vaccines: see vaccine section EPIC -pap history: due -FDLMP: see nursing notes -sexual activity: yes, female partner, no new partners -wants STI testing (Hep C if born 61-65): no -FH breast, colon or ovarian ca: see FH Last mammogram: 2017 Last colon cancer screening: n/a Breast Ca Risk Assessment: see family history and pt history DEXA (>/= 65): n/a  -Alcohol, Tobacco, drug use: see social history  Review of Systems - no fevers, unintentional weight loss, vision loss, hearing loss, chest pain, sob, hemoptysis, melena, hematochezia, hematuria, genital discharge, changing or concerning skin lesions, bleeding, bruising, loc, thoughts of self harm or SI  Past Medical History:  Diagnosis Date  . Migraine    menstrual related  . Migraine     Past Surgical History:  Procedure Laterality Date  . CESAREAN SECTION  01/02/2010   repeat C-section  . CESAREAN SECTION  02/01/2005   postdates    Family History  Problem Relation Age of Onset  . Arthritis Mother        s/p knee replacement  . Hypertension Mother   . Liver cancer Mother 56  . Pancreatic  cancer Mother 21  . Migraines Father   . Hypertension Father   . Migraines Sister     Social History   Socioeconomic History  . Marital status: Married    Spouse name: Not on file  . Number of children: Not on file  . Years of education: Not on file  . Highest education level: Not on file  Occupational History  . Not on file  Social Needs  . Financial resource strain: Not on file  . Food insecurity:    Worry: Not on file    Inability: Not on file  . Transportation needs:    Medical: Not on file    Non-medical: Not on file  Tobacco Use  . Smoking status: Never Smoker  . Smokeless tobacco: Never Used  Substance and Sexual Activity  . Alcohol use: Not on file  . Drug use: Not on file  . Sexual activity: Not on file  Lifestyle  . Physical activity:    Days per week: Not on file    Minutes per session: Not on file  . Stress: Not on file  Relationships  . Social connections:    Talks on phone: Not on file    Gets together: Not on file    Attends religious service: Not on file    Active member of club or organization: Not on file    Attends meetings of clubs or organizations: Not on file    Relationship status: Not on file  Other Topics Concern  . Not on file  Social History Narrative   Work or School:  [employer]      Home Situation:lives with huaband and 2 children 35 yo G and 27 yo B in 2019      Spiritual Beliefs:hindu      Lifestyle:regular exercise   Vegetarian    No current outpatient medications on file.  EXAM:  Vitals:   04/02/18 1501  BP: (!) 84/60  Pulse: 84  Temp: 98.6 F (37 C)    GENERAL: vitals reviewed and listed below, alert, oriented, appears well hydrated and in no acute distress  HEENT: head atraumatic, PERRLA, normal appearance of eyes, ears, nose and mouth. moist mucus membranes.  NECK: supple, no masses or lymphadenopathy  LUNGS: clear to auscultation bilaterally, no rales, rhonchi or wheeze  CV: HRRR, no peripheral  edema or cyanosis, normal pedal pulses  ABDOMEN: bowel sounds normal, soft, non tender to palpation, no masses, no rebound or guarding  BREAST: normal appearance - no skin lesions or discharge noted on inspection of both breasts, on palpation of both breast and axillary region no suspicious lesions appreciated today  GU: normal appearance of external genitalia - no lesions or masses appreciated, normal appearing vaginal mucosa - no abnormal discharge, normal appearance of cervix - no lesions or abnormal discharge observed  RECTAL: deferred  SKIN: no rash or abnormal lesions  MS: normal gait, moves all extremities normally  NEURO: normal gait, speech and thought processing grossly intact, muscle tone grossly intact throughout  PSYCH: normal affect, pleasant and cooperative  ASSESSMENT AND PLAN:  Discussed the following assessment and plan:  PREVENTIVE EXAM: -Discussed and advised all [place] preventive services health task force level A and B recommendations for age, sex and risks. -Advised at least 150 minutes of exercise per week and a healthy diet with avoidance of (less then 1 serving per week) processed foods, white starches, red meat, fast foods and sweets and consisting of: * 5-9 servings of fresh fruits and vegetables (not corn or potatoes) *nuts and seeds, beans *olives and olive oil *lean meats such as fish and white chicken  *whole grains -labs, studies and vaccines per orders this encounter -pap obtained -she wanted flu shot after discussion risks/benefits  Patient advised to return to clinic immediately if symptoms worsen or persist or new concerns.  Patient Instructions  BEFORE YOU LEAVE: -flu shot -labs -follow up: OMM for back pain - asap per patient preference - please always check in 15 minutes prior to your appointment for your nurse and check in visit.  We have ordered labs or studies at this visit. It can take up to 1-2 weeks for results and processing. IF  results require follow up or explanation, we will call you with instructions. Clinically stable results will be released to your [facility]. If you have not heard from [place] or cannot find your results in [facility] in 2 weeks please contact our office at [phone].  If you are not yet signed up for [facility], please consider signing up.   Preventive Care 18-39 Years, Female Preventive care refers to lifestyle choices and visits with your health care provider that can promote health and wellness. What does preventive care include?  A yearly physical exam. This is also called an annual well check.  Dental exams once or twice a year.  Routine eye exams. Ask your health care provider how often you should have your eyes checked.  Personal lifestyle choices, including: ? Daily care of your teeth and gums. ? Regular physical activity. ? Eating a healthy diet. ? Avoiding tobacco  and drug use. ? Limiting alcohol use. ? Practicing safe sex. ? Taking vitamin and mineral supplements as recommended by your health care provider. What happens during an annual well check? The services and screenings done by your health care provider during your annual well check will depend on your age, overall health, lifestyle risk factors, and family history of disease. Counseling Your health care provider may ask you questions about your:  Alcohol use.  Tobacco use.  Drug use.  Emotional well-being.  Home and relationship well-being.  Sexual activity.  Eating habits.  Work and work Statistician.  Method of birth control.  Menstrual cycle.  Pregnancy history.  Screening You may have the following tests or measurements:  Height, weight, and BMI.  Diabetes screening. This is done by checking your blood sugar (glucose) after you have not eaten for a while (fasting).  Blood pressure.  Lipid and cholesterol levels. These may be checked every 5 years starting at age 43.  Skin check.  Hepatitis C  blood test.  Hepatitis B blood test.  Sexually transmitted disease (STD) testing.  BRCA-related cancer screening. This may be done if you have a family history of breast, ovarian, tubal, or peritoneal cancers.  Pelvic exam and Pap test. This may be done every 3 years starting at age 73. Starting at age 3, this may be done every 5 years if you have a Pap test in combination with an HPV test.  Discuss your test results, treatment options, and if necessary, the need for more tests with your health care provider. Vaccines Your health care provider may recommend certain vaccines, such as:  Influenza vaccine. This is recommended every year.  Tetanus, diphtheria, and acellular pertussis (Tdap, Td) vaccine. You may need a Td booster every 10 years.  Varicella vaccine. You may need this if you have not been vaccinated.  HPV vaccine. If you are 66 or younger, you may need three doses over 6 months.  Measles, mumps, and rubella (MMR) vaccine. You may need at least one dose of MMR. You may also need a second dose.  Pneumococcal 13-valent conjugate (PCV13) vaccine. You may need this if you have certain conditions and were not previously vaccinated.  Pneumococcal polysaccharide (PPSV23) vaccine. You may need one or two doses if you smoke cigarettes or if you have certain conditions.  Meningococcal vaccine. One dose is recommended if you are age 70-21 years and a first-year college student living in a residence hall, or if you have one of several medical conditions. You may also need additional booster doses.  Hepatitis A vaccine. You may need this if you have certain conditions or if you travel or work in places where you may be exposed to hepatitis A.  Hepatitis B vaccine. You may need this if you have certain conditions or if you travel or work in places where you may be exposed to hepatitis B.  Haemophilus influenzae type b (Hib) vaccine. You may need this if you have certain risk  factors.  Talk to your health care provider about which screenings and vaccines you need and how often you need them. This information is not intended to replace advice given to you by your health care provider. Make sure you discuss any questions you have with your health care provider. Document Released: 08/27/2001 Document Revised: 03/20/2016 Document Reviewed: 05/02/2015 Elsevier Interactive Patient Education  2018 [employer].          No follow-ups on file.  Lucretia Kern, DO

## 2018-04-03 LAB — HEMOGLOBIN A1C: Hgb A1c MFr Bld: 5.1 % (ref 4.6–6.5)

## 2018-04-03 LAB — CHOLESTEROL, TOTAL: Cholesterol: 117 mg/dL (ref 0–200)

## 2018-04-03 LAB — HDL CHOLESTEROL: HDL: 39.8 mg/dL (ref >39.00)

## 2018-04-07 LAB — CYTOLOGY - PAP
Diagnosis: NEGATIVE
HPV (WINDOPATH): NOT DETECTED

## 2018-04-08 DIAGNOSIS — Z713 Dietary counseling and surveillance: Secondary | ICD-10-CM | POA: Diagnosis not present

## 2018-04-14 ENCOUNTER — Ambulatory Visit (INDEPENDENT_AMBULATORY_CARE_PROVIDER_SITE_OTHER): Payer: BLUE CROSS/BLUE SHIELD | Admitting: Family Medicine

## 2018-04-14 ENCOUNTER — Ambulatory Visit: Payer: BLUE CROSS/BLUE SHIELD | Admitting: Family Medicine

## 2018-04-14 ENCOUNTER — Encounter: Payer: Self-pay | Admitting: Family Medicine

## 2018-04-14 VITALS — BP 98/78 | HR 59 | Temp 98.9°F | Ht 63.75 in | Wt 131.9 lb

## 2018-04-14 DIAGNOSIS — M9901 Segmental and somatic dysfunction of cervical region: Secondary | ICD-10-CM

## 2018-04-14 DIAGNOSIS — M9902 Segmental and somatic dysfunction of thoracic region: Secondary | ICD-10-CM | POA: Diagnosis not present

## 2018-04-14 DIAGNOSIS — M9900 Segmental and somatic dysfunction of head region: Secondary | ICD-10-CM

## 2018-04-14 DIAGNOSIS — M9903 Segmental and somatic dysfunction of lumbar region: Secondary | ICD-10-CM

## 2018-04-14 DIAGNOSIS — G8929 Other chronic pain: Secondary | ICD-10-CM | POA: Diagnosis not present

## 2018-04-14 DIAGNOSIS — M9905 Segmental and somatic dysfunction of pelvic region: Secondary | ICD-10-CM | POA: Diagnosis not present

## 2018-04-14 DIAGNOSIS — M545 Low back pain: Secondary | ICD-10-CM | POA: Diagnosis not present

## 2018-04-14 DIAGNOSIS — M9906 Segmental and somatic dysfunction of lower extremity: Secondary | ICD-10-CM

## 2018-04-14 NOTE — Patient Instructions (Signed)
BEFORE YOU LEAVE: -follow up: 1-2 weeks for OMM  Work on posture at work.   Topical menthol (tiger balm) and heat if needed for pain.
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
## 2018-06-04 ENCOUNTER — Ambulatory Visit: Payer: BLUE CROSS/BLUE SHIELD | Admitting: Family Medicine

## 2018-06-04 DIAGNOSIS — J209 Acute bronchitis, unspecified: Secondary | ICD-10-CM | POA: Diagnosis not present

## 2018-06-17 ENCOUNTER — Encounter: Payer: Self-pay | Admitting: Family Medicine

## 2018-06-29 ENCOUNTER — Encounter: Payer: Self-pay | Admitting: Family Medicine

## 2018-06-29 ENCOUNTER — Ambulatory Visit: Payer: BLUE CROSS/BLUE SHIELD | Admitting: Family Medicine

## 2018-06-29 ENCOUNTER — Encounter: Payer: Self-pay | Admitting: *Deleted

## 2018-06-29 VITALS — BP 122/76 | HR 79 | Temp 98.9°F | Resp 12 | Ht 63.75 in | Wt 127.0 lb

## 2018-06-29 DIAGNOSIS — R059 Cough, unspecified: Secondary | ICD-10-CM

## 2018-06-29 DIAGNOSIS — R05 Cough: Secondary | ICD-10-CM

## 2018-06-29 DIAGNOSIS — R509 Fever, unspecified: Secondary | ICD-10-CM

## 2018-06-29 DIAGNOSIS — J101 Influenza due to other identified influenza virus with other respiratory manifestations: Secondary | ICD-10-CM

## 2018-06-29 LAB — POCT INFLUENZA A/B
INFLUENZA B, POC: POSITIVE — AB
Influenza A, POC: NEGATIVE

## 2018-06-29 MED ORDER — OSELTAMIVIR PHOSPHATE 75 MG PO CAPS: 75.0000 mg | ORAL_CAPSULE | Freq: Two times a day (BID) | ORAL | 0 refills | Status: AC

## 2018-06-29 NOTE — Progress Notes (Signed)
ACUTE VISIT  HPI:  Chief Complaint  Patient presents with  . Cough    dry cough x 2 days  . Low grade fever    started this morning  . Generalized Body Aches    started this morning    [name] is a 42 y.o.female here today complaining of 2 days of respiratory symptoms.  She mentions that on 06/05/2018 she was diagnosed with acute bronchitis.  She was treated with albuterol inhaler and benzonatate, she recovered completely. 2 days ago she started with non-productive cough, post tussive nausea and vomiting x 2. She has not noted wheezing or dyspnea. Today she has subjective fever, nasal congestion, rhinorrhea, and headache. Body aches and fatigue.   Cough  This is a new problem. The current episode started in the past 7 days. The problem has been unchanged. The problem occurs every few minutes. The cough is non-productive. Associated symptoms include chills, a fever, headaches, myalgias, nasal congestion, postnasal drip and rhinorrhea. Pertinent negatives include no chest pain, ear congestion, ear pain, heartburn, rash, sore throat, shortness of breath or wheezing. She has tried a beta-agonist inhaler for the symptoms. The treatment provided mild relief. There is no history of asthma, COPD or environmental allergies.   No Hx of recent travel. Coworker was diagnosed with influenza but she was not in close contact. No known insect bite.  Hx of allergies: Denies  OTC medications for this problem: Advil. She has also used albuterol inhaler, which helps with cough.   Review of Systems  Constitutional: Positive for activity change, chills, fatigue and fever. Negative for appetite change.  HENT: Positive for congestion, postnasal drip and rhinorrhea. Negative for ear pain and sore throat.   Respiratory: Positive for cough. Negative for shortness of breath and wheezing.   Cardiovascular: Negative for chest pain.  Gastrointestinal: Positive for nausea and vomiting.  Negative for abdominal pain, diarrhea and heartburn.  Genitourinary: Negative for decreased urine volume, dysuria and hematuria.  Musculoskeletal: Positive for myalgias. Negative for neck pain.  Skin: Negative for rash.  Allergic/Immunologic: Negative for environmental allergies.  Neurological: Positive for headaches. Negative for syncope.  Hematological: Negative for adenopathy. Does not bruise/bleed easily.  Psychiatric/Behavioral: Negative for confusion. The patient is nervous/anxious.       Current Outpatient Medications on File Prior to Visit  Medication Sig Dispense Refill  . albuterol (VENTOLIN HFA) 108 (90 Base) MCG/ACT inhaler 1-2 inhalations every 4-6 hours as needed for wheezing. Dispense spacer as needed.     No current facility-administered medications on file prior to visit.      Past Medical History:  Diagnosis Date  . Migraine    menstrual related  . Migraine    No Known Allergies  Social History   Socioeconomic History  . Marital status: Married    Spouse name: Not on file  . Number of children: Not on file  . Years of education: Not on file  . Highest education level: Not on file  Occupational History  . Not on file  Social Needs  . Financial resource strain: Not on file  . Food insecurity:    Worry: Not on file    Inability: Not on file  . Transportation needs:    Medical: Not on file    Non-medical: Not on file  Tobacco Use  . Smoking status: Never Smoker  . Smokeless tobacco: Never Used  Substance and Sexual Activity  . Alcohol use: Not on file  .  Drug use: Not on file  . Sexual activity: Not on file  Lifestyle  . Physical activity:    Days per week: Not on file    Minutes per session: Not on file  . Stress: Not on file  Relationships  . Social connections:    Talks on phone: Not on file    Gets together: Not on file    Attends religious service: Not on file    Active member of club or organization: Not on file    Attends meetings  of clubs or organizations: Not on file    Relationship status: Not on file  Other Topics Concern  . 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  Physical Exam  Constitutional: She is oriented to person, place, and time. She appears well-developed and well-nourished. She does not appear ill. No distress.  HENT:  Head: Normocephalic and atraumatic.  Right Ear: Tympanic membrane, external ear and ear canal normal.  Left Ear: Tympanic membrane, external ear and ear canal normal.  Nose: Rhinorrhea present. Right sinus exhibits no maxillary sinus tenderness and no frontal sinus tenderness. Left sinus exhibits no maxillary sinus tenderness and no frontal sinus tenderness.  Mouth/Throat: Oropharynx is clear and moist and mucous membranes are normal.  Hypertrophic turbinate, left. Postnasal drainage.  Eyes: Conjunctivae are normal.  Cardiovascular: Normal rate and regular rhythm.  No murmur heard. Respiratory: Effort normal and breath sounds normal. No stridor. No respiratory distress.  Lymphadenopathy:       Head (right side): No submandibular adenopathy present.       Head (left side): No submandibular adenopathy present.    She has no cervical adenopathy.  Neurological: She is alert and oriented to person, place, and time. She has normal strength.  Skin: Skin is warm. No rash noted. No erythema.  Psychiatric: She has a normal mood and affect.  Well groomed, good eye contact.    ASSESSMENT AND PLAN:  Ms. [name] was seen today for cough, low grade fever and generalized body aches.  Diagnoses and all orders for this visit:  Fever, unspecified fever cause -      POC Influenza A/B  Cough -     POC Influenza A/B  Influenza B -     oseltamivir (TAMIFLU) 75 MG capsule; Take 1 capsule (75 mg total) by mouth 2 (two) times daily for 5 days.  Educated about diagnosis. Tamiflu x 5 days recommended. Continue monitoring temperature, explained that she can go back to work after being fever free for 24 hours. She can alternate between Tylenol and Advil. Adequate hydration. Instructed about warning signs. Follow-up as needed.       Davien Malone G. Swazil[place], MD  [facility]. [place] office.

## 2018-06-29 NOTE — Patient Instructions (Signed)
A few things to remember from today's visit:   Cough - Plan: POC Influenza A/B  Fever, unspecified fever cause - Plan: POC Influenza A/B  Influenza B - Plan: oseltamivir (TAMIFLU) 75 MG capsule   Influenza, Adult Influenza ("the flu") is an infection in the lungs, nose, and throat (respiratory tract). It is caused by a virus. The flu causes many common cold symptoms, as well as a high fever and body aches. It can make you feel very sick. The flu spreads easily from person to person (is contagious). Getting a flu shot (influenza vaccination) every year is the best way to prevent the flu. Follow these instructions at home:  Take over-the-counter and prescription medicines only as told by your doctor.  Use a cool mist humidifier to add moisture (humidity) to the air in your home. This can make it easier to breathe.  Rest as needed.  Drink enough fluid to keep your pee (urine) clear or pale yellow.  Cover your mouth and nose when you cough or sneeze.  Wash your hands with soap and water often, especially after you cough or sneeze. If you cannot use soap and water, use hand sanitizer.  Stay home from work or school as told by your doctor. Unless you are visiting your doctor, try to avoid leaving home until your fever has been gone for 24 hours without the use of medicine.  Keep all follow-up visits as told by your doctor. This is important. How is this prevented?  Getting a yearly (annual) flu shot is the best way to avoid getting the flu. You may get the flu shot in late summer, fall, or winter. Ask your doctor when you should get your flu shot.  Wash your hands often or use hand sanitizer often.  Avoid contact with people who are sick during cold and flu season.  Eat healthy foods.  Drink plenty of fluids.  Get enough sleep.  Exercise regularly. Contact a doctor if:  You get new symptoms.  You have: ? Chest pain. ? Watery poop (diarrhea). ? A fever.  Your cough gets  worse.  You start to have more mucus.  You feel sick to your stomach (nauseous).  You throw up (vomit). Get help right away if:  You start to be short of breath or have trouble breathing.  Your skin or nails turn a bluish color.  You have very bad pain or stiffness in your neck.  You get a sudden headache.  You get sudden pain in your face or ear.  You cannot stop throwing up. This information is not intended to replace advice given to you by your health care provider. Make sure you discuss any questions you have with your health care provider. Document Released: 04/09/2008 Document Revised: 12/07/2015 Document Reviewed: 04/25/2015 Elsevier Interactive Patient Education  2017 [employer]  Please be sure medication list is accurate. If a new problem present, please set up appointment sooner than planned today.

## 2018-09-22 DIAGNOSIS — L723 Sebaceous cyst: Secondary | ICD-10-CM | POA: Diagnosis not present

## 2019-08-22 IMAGING — DX DG LUMBAR SPINE COMPLETE 4+V
5 series · 5 of 5 positions shown · non-contrast
Comparison: None.

CLINICAL DATA: 41-year-old female with 1 year of lumbar back pain,
no known injury.

EXAM:
LUMBAR SPINE - COMPLETE 4+ VIEW

[lumbar spine ap]
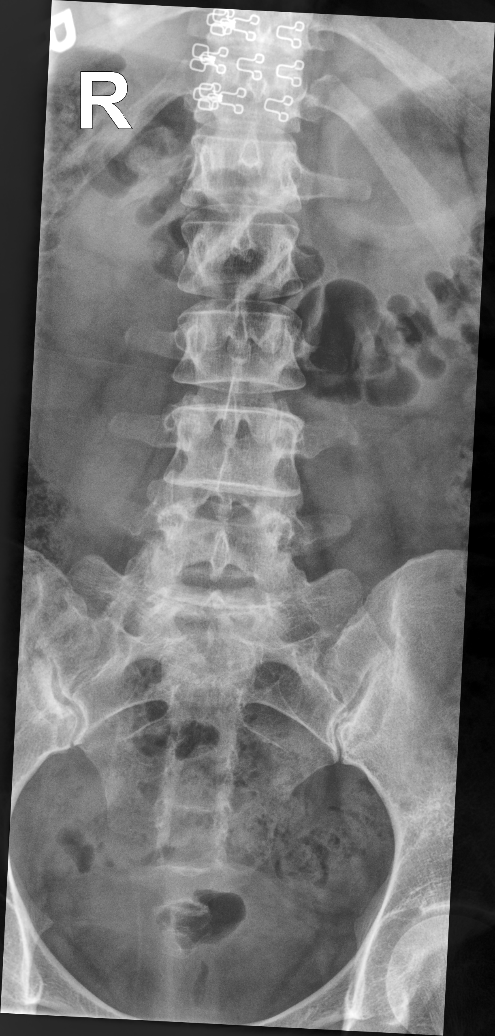

[lumbar spine oblique (1 of 2)]
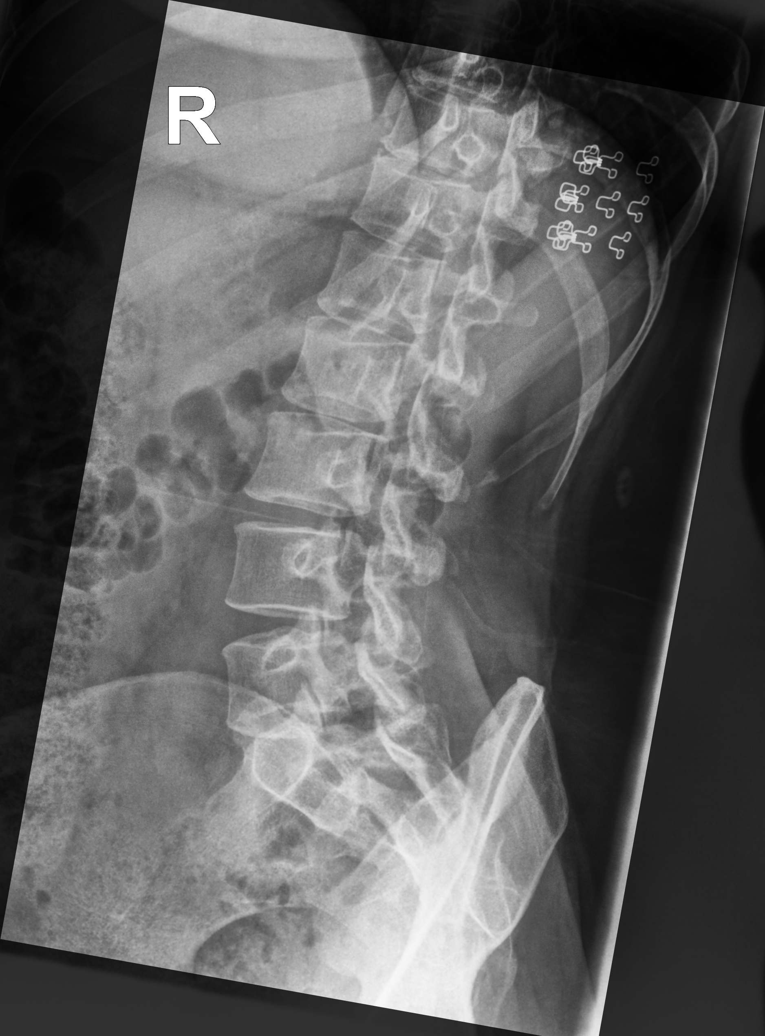

[lumbar spine oblique (2 of 2)]
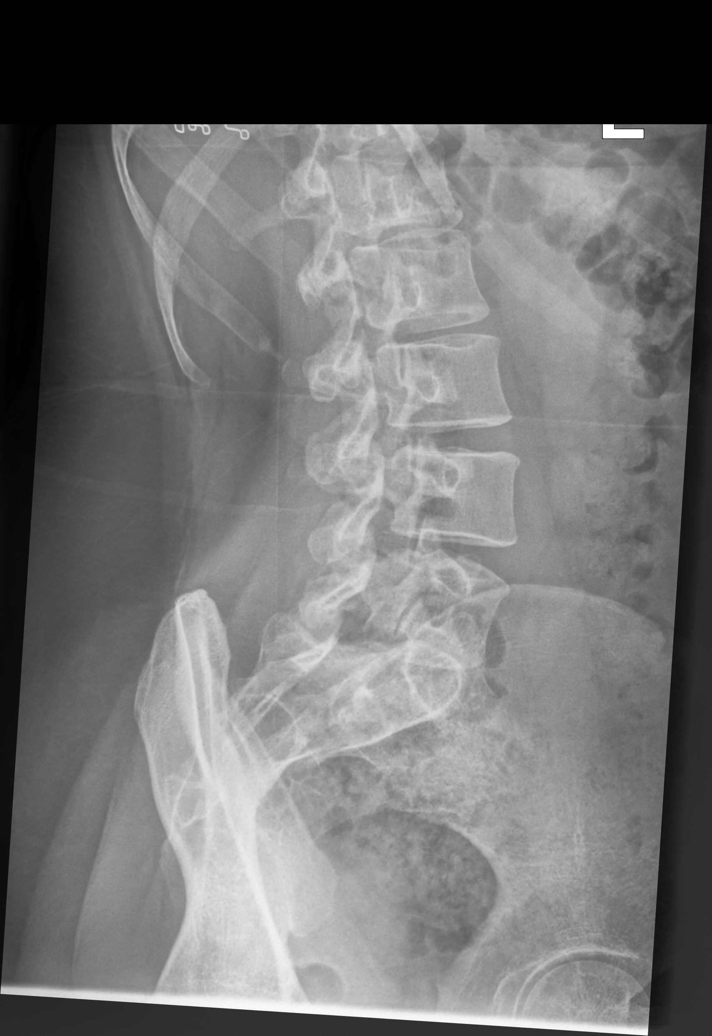

[lumbar spine lat (1 of 2)]
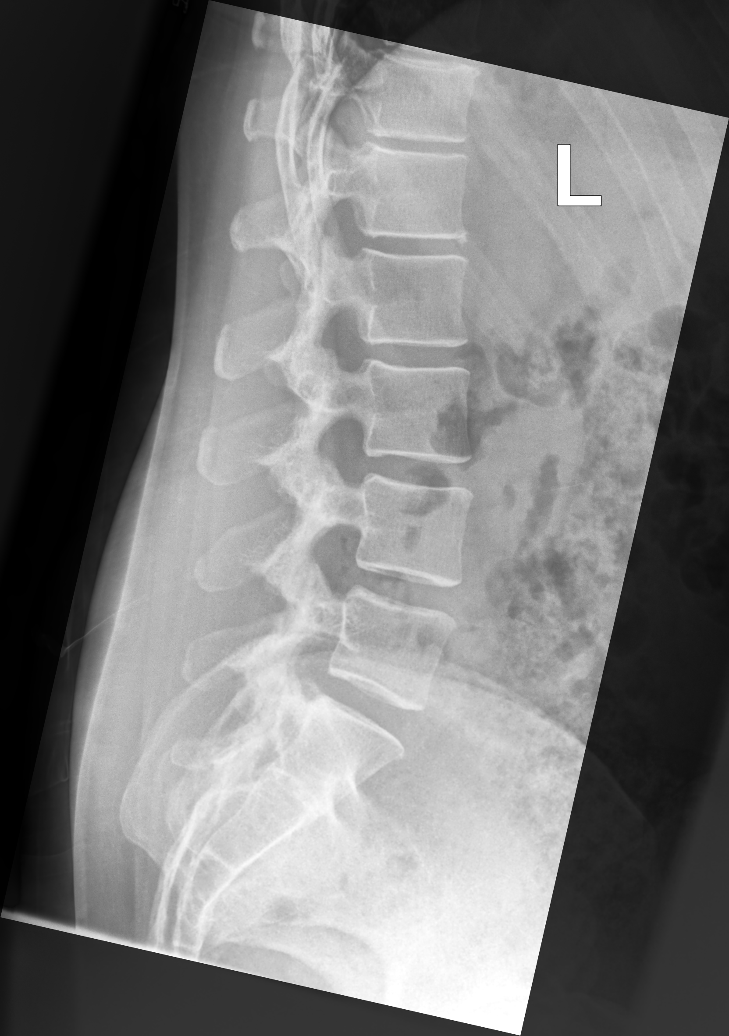

[lumbar spine lat (2 of 2)]
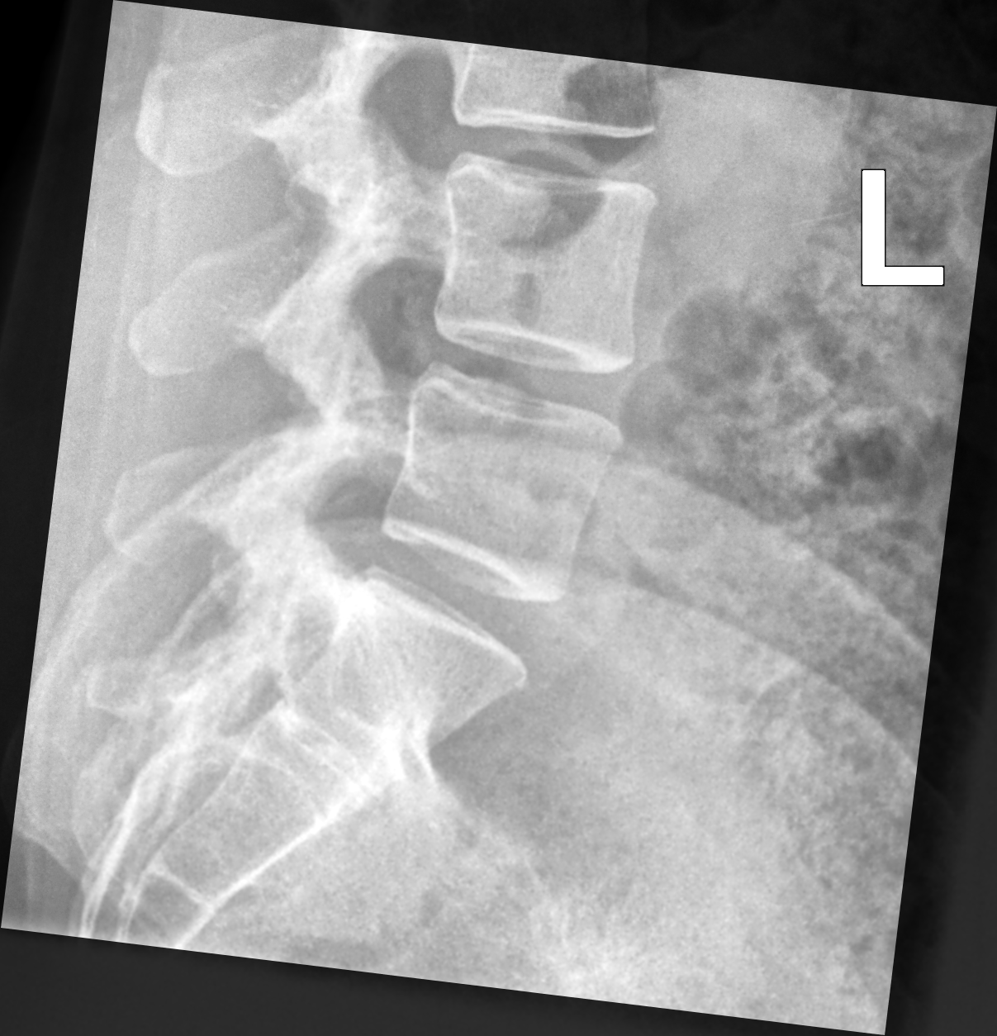

[5 of 5 positions shown; findings below may reference images not displayed]

FINDINGS: Normal lumbar segmentation. Bone mineralization is within normal
limits. Mild straightening of lordosis but otherwise normal
vertebral height and alignment. No pars fracture. Preserved disc
spaces. The sacral ala, SI joints, and visible lower thoracic levels
appear normal. No acute osseous abnormality identified. Negative
visible abdominal and pelvic visceral contours.
IMPRESSION: Normal radiographic appearance of the lumbar spine.

## 2019-09-30 DIAGNOSIS — L720 Epidermal cyst: Secondary | ICD-10-CM | POA: Diagnosis not present

## 2019-09-30 DIAGNOSIS — L723 Sebaceous cyst: Secondary | ICD-10-CM | POA: Diagnosis not present

## 2019-10-28 DIAGNOSIS — L723 Sebaceous cyst: Secondary | ICD-10-CM | POA: Diagnosis not present

## 2019-10-28 DIAGNOSIS — L720 Epidermal cyst: Secondary | ICD-10-CM | POA: Diagnosis not present

## 2020-04-11 DIAGNOSIS — H524 Presbyopia: Secondary | ICD-10-CM | POA: Diagnosis not present

## 2020-09-28 DIAGNOSIS — Z79899 Other long term (current) drug therapy: Secondary | ICD-10-CM | POA: Diagnosis not present

## 2020-09-28 DIAGNOSIS — E538 Deficiency of other specified B group vitamins: Secondary | ICD-10-CM | POA: Diagnosis not present

## 2020-09-28 DIAGNOSIS — Z0000 Encounter for general adult medical examination without abnormal findings: Secondary | ICD-10-CM | POA: Diagnosis not present

## 2020-09-28 DIAGNOSIS — Z124 Encounter for screening for malignant neoplasm of cervix: Secondary | ICD-10-CM | POA: Diagnosis not present

## 2020-09-28 DIAGNOSIS — Z1331 Encounter for screening for depression: Secondary | ICD-10-CM | POA: Diagnosis not present

## 2020-09-28 DIAGNOSIS — E559 Vitamin D deficiency, unspecified: Secondary | ICD-10-CM | POA: Diagnosis not present

## 2021-05-22 DIAGNOSIS — Z23 Encounter for immunization: Secondary | ICD-10-CM | POA: Diagnosis not present

## 2021-10-02 DIAGNOSIS — Z6821 Body mass index (BMI) 21.0-21.9, adult: Secondary | ICD-10-CM | POA: Diagnosis not present

## 2021-10-02 DIAGNOSIS — Z79899 Other long term (current) drug therapy: Secondary | ICD-10-CM | POA: Diagnosis not present

## 2021-10-02 DIAGNOSIS — E538 Deficiency of other specified B group vitamins: Secondary | ICD-10-CM | POA: Diagnosis not present

## 2021-10-02 DIAGNOSIS — Z13220 Encounter for screening for lipoid disorders: Secondary | ICD-10-CM | POA: Diagnosis not present

## 2021-10-02 DIAGNOSIS — Z131 Encounter for screening for diabetes mellitus: Secondary | ICD-10-CM | POA: Diagnosis not present

## 2021-10-02 DIAGNOSIS — E559 Vitamin D deficiency, unspecified: Secondary | ICD-10-CM | POA: Diagnosis not present

## 2021-10-02 DIAGNOSIS — Z0000 Encounter for general adult medical examination without abnormal findings: Secondary | ICD-10-CM | POA: Diagnosis not present

## 2021-12-17 DIAGNOSIS — G43109 Migraine with aura, not intractable, without status migrainosus: Secondary | ICD-10-CM | POA: Diagnosis not present

## 2021-12-17 DIAGNOSIS — Z6821 Body mass index (BMI) 21.0-21.9, adult: Secondary | ICD-10-CM | POA: Diagnosis not present

## 2021-12-17 DIAGNOSIS — N926 Irregular menstruation, unspecified: Secondary | ICD-10-CM | POA: Diagnosis not present

## 2022-02-07 DIAGNOSIS — E785 Hyperlipidemia, unspecified: Secondary | ICD-10-CM | POA: Diagnosis not present

## 2022-02-07 DIAGNOSIS — Z6821 Body mass index (BMI) 21.0-21.9, adult: Secondary | ICD-10-CM | POA: Diagnosis not present

## 2022-02-07 DIAGNOSIS — E538 Deficiency of other specified B group vitamins: Secondary | ICD-10-CM | POA: Diagnosis not present

## 2022-02-07 DIAGNOSIS — G43109 Migraine with aura, not intractable, without status migrainosus: Secondary | ICD-10-CM | POA: Diagnosis not present

## 2022-02-13 DIAGNOSIS — E538 Deficiency of other specified B group vitamins: Secondary | ICD-10-CM | POA: Diagnosis not present

## 2022-02-13 DIAGNOSIS — E785 Hyperlipidemia, unspecified: Secondary | ICD-10-CM | POA: Diagnosis not present

## 2022-04-09 ENCOUNTER — Other Ambulatory Visit: Payer: Self-pay | Admitting: Internal Medicine

## 2022-04-09 DIAGNOSIS — Z136 Encounter for screening for cardiovascular disorders: Secondary | ICD-10-CM

## 2022-04-17 ENCOUNTER — Ambulatory Visit
Admission: RE | Admit: 2022-04-17 | Discharge: 2022-04-17 | Disposition: A | Discharge status: Home / Self Care | Payer: BC Managed Care – PPO | Source: Ambulatory Visit | Attending: Internal Medicine | Admitting: Internal Medicine

## 2022-04-17 DIAGNOSIS — Z136 Encounter for screening for cardiovascular disorders: Secondary | ICD-10-CM

## 2022-04-17 DIAGNOSIS — R911 Solitary pulmonary nodule: Secondary | ICD-10-CM | POA: Diagnosis not present

## 2022-11-07 DIAGNOSIS — Z7960 Long term (current) use of unspecified immunomodulators and immunosuppressants: Secondary | ICD-10-CM | POA: Diagnosis not present

## 2022-11-07 DIAGNOSIS — E785 Hyperlipidemia, unspecified: Secondary | ICD-10-CM | POA: Diagnosis not present

## 2022-11-07 DIAGNOSIS — Z13220 Encounter for screening for lipoid disorders: Secondary | ICD-10-CM | POA: Diagnosis not present

## 2022-11-07 DIAGNOSIS — Z6822 Body mass index (BMI) 22.0-22.9, adult: Secondary | ICD-10-CM | POA: Diagnosis not present

## 2022-11-07 DIAGNOSIS — Z124 Encounter for screening for malignant neoplasm of cervix: Secondary | ICD-10-CM | POA: Diagnosis not present

## 2022-11-07 DIAGNOSIS — Z131 Encounter for screening for diabetes mellitus: Secondary | ICD-10-CM | POA: Diagnosis not present

## 2022-11-07 DIAGNOSIS — Z0000 Encounter for general adult medical examination without abnormal findings: Secondary | ICD-10-CM | POA: Diagnosis not present

## 2022-11-07 DIAGNOSIS — E559 Vitamin D deficiency, unspecified: Secondary | ICD-10-CM | POA: Diagnosis not present

## 2022-11-07 DIAGNOSIS — E538 Deficiency of other specified B group vitamins: Secondary | ICD-10-CM | POA: Diagnosis not present

## 2022-11-07 DIAGNOSIS — L853 Xerosis cutis: Secondary | ICD-10-CM | POA: Diagnosis not present

## 2023-12-30 DIAGNOSIS — Z79899 Other long term (current) drug therapy: Secondary | ICD-10-CM | POA: Diagnosis not present

## 2023-12-30 DIAGNOSIS — E559 Vitamin D deficiency, unspecified: Secondary | ICD-10-CM | POA: Diagnosis not present

## 2023-12-30 DIAGNOSIS — Z0000 Encounter for general adult medical examination without abnormal findings: Secondary | ICD-10-CM | POA: Diagnosis not present

## 2023-12-30 DIAGNOSIS — Z131 Encounter for screening for diabetes mellitus: Secondary | ICD-10-CM | POA: Diagnosis not present

## 2023-12-30 DIAGNOSIS — Z13220 Encounter for screening for lipoid disorders: Secondary | ICD-10-CM | POA: Diagnosis not present

## 2023-12-30 DIAGNOSIS — E538 Deficiency of other specified B group vitamins: Secondary | ICD-10-CM | POA: Diagnosis not present

## 2023-12-30 DIAGNOSIS — Z6822 Body mass index (BMI) 22.0-22.9, adult: Secondary | ICD-10-CM | POA: Diagnosis not present

## 2023-12-31 ENCOUNTER — Other Ambulatory Visit: Payer: Self-pay | Admitting: Internal Medicine

## 2023-12-31 DIAGNOSIS — Z1231 Encounter for screening mammogram for malignant neoplasm of breast: Secondary | ICD-10-CM
